# Patient Record
Sex: Male | Born: 1965 | Race: White | Hispanic: No | Marital: Married | Smoking: Never smoker
Health system: Southern US, Community
[De-identification: ages and names within clinical notes are randomized; demographics above are authoritative.]

## PROBLEM LIST (undated history)

## (undated) ENCOUNTER — Ambulatory Visit: Admission: EM | Payer: BC Managed Care – PPO

## (undated) DIAGNOSIS — R7303 Prediabetes: Secondary | ICD-10-CM

## (undated) DIAGNOSIS — Z8719 Personal history of other diseases of the digestive system: Secondary | ICD-10-CM

## (undated) DIAGNOSIS — K219 Gastro-esophageal reflux disease without esophagitis: Secondary | ICD-10-CM

## (undated) DIAGNOSIS — M109 Gout, unspecified: Secondary | ICD-10-CM

## (undated) DIAGNOSIS — M1990 Unspecified osteoarthritis, unspecified site: Secondary | ICD-10-CM

## (undated) DIAGNOSIS — K222 Esophageal obstruction: Secondary | ICD-10-CM

## (undated) DIAGNOSIS — J45909 Unspecified asthma, uncomplicated: Secondary | ICD-10-CM

## (undated) HISTORY — DX: Esophageal obstruction: K22.2

## (undated) HISTORY — PX: KNEE ARTHROSCOPY: SHX127

---

## 2007-07-16 ENCOUNTER — Ambulatory Visit: Payer: Self-pay | Admitting: Orthopaedic Surgery

## 2007-08-20 ENCOUNTER — Ambulatory Visit: Payer: Self-pay | Admitting: Orthopaedic Surgery

## 2008-11-16 ENCOUNTER — Emergency Department: Payer: Self-pay | Admitting: Emergency Medicine

## 2010-01-20 ENCOUNTER — Ambulatory Visit: Payer: Self-pay | Admitting: Otolaryngology

## 2010-05-05 ENCOUNTER — Ambulatory Visit: Payer: Self-pay | Admitting: Otolaryngology

## 2013-09-05 LAB — LIPID PANEL
CHOLESTEROL: 252 mg/dL — AB (ref 0–200)
HDL: 50 mg/dL (ref 35–70)
LDL CALC: 166 mg/dL
Triglycerides: 181 mg/dL — AB (ref 40–160)

## 2013-09-05 LAB — BASIC METABOLIC PANEL
BUN: 19 mg/dL (ref 4–21)
Creatinine: 1.1 mg/dL (ref 0.6–1.3)
GLUCOSE: 97 mg/dL
POTASSIUM: 4.5 mmol/L (ref 3.4–5.3)
Sodium: 143 mmol/L (ref 137–147)

## 2013-09-05 LAB — PSA: PSA: 0.9

## 2015-09-06 DIAGNOSIS — M109 Gout, unspecified: Secondary | ICD-10-CM | POA: Insufficient documentation

## 2015-09-06 DIAGNOSIS — Z8042 Family history of malignant neoplasm of prostate: Secondary | ICD-10-CM | POA: Insufficient documentation

## 2015-09-06 DIAGNOSIS — K648 Other hemorrhoids: Secondary | ICD-10-CM | POA: Insufficient documentation

## 2015-09-06 DIAGNOSIS — E669 Obesity, unspecified: Secondary | ICD-10-CM | POA: Insufficient documentation

## 2015-09-06 DIAGNOSIS — J309 Allergic rhinitis, unspecified: Secondary | ICD-10-CM | POA: Insufficient documentation

## 2015-09-06 DIAGNOSIS — J45909 Unspecified asthma, uncomplicated: Secondary | ICD-10-CM | POA: Insufficient documentation

## 2015-09-06 DIAGNOSIS — E785 Hyperlipidemia, unspecified: Secondary | ICD-10-CM | POA: Insufficient documentation

## 2015-10-08 ENCOUNTER — Encounter: Payer: Self-pay | Admitting: Family Medicine

## 2015-10-08 ENCOUNTER — Ambulatory Visit (INDEPENDENT_AMBULATORY_CARE_PROVIDER_SITE_OTHER): Payer: BLUE CROSS/BLUE SHIELD | Admitting: Family Medicine

## 2015-10-08 VITALS — BP 120/70 | HR 79 | Temp 98.3°F | Resp 16 | Ht 72.0 in | Wt 251.0 lb

## 2015-10-08 DIAGNOSIS — Z1159 Encounter for screening for other viral diseases: Secondary | ICD-10-CM

## 2015-10-08 DIAGNOSIS — Z8739 Personal history of other diseases of the musculoskeletal system and connective tissue: Secondary | ICD-10-CM

## 2015-10-08 DIAGNOSIS — Z1211 Encounter for screening for malignant neoplasm of colon: Secondary | ICD-10-CM

## 2015-10-08 DIAGNOSIS — Z125 Encounter for screening for malignant neoplasm of prostate: Secondary | ICD-10-CM

## 2015-10-08 DIAGNOSIS — Z0000 Encounter for general adult medical examination without abnormal findings: Secondary | ICD-10-CM

## 2015-10-08 DIAGNOSIS — E669 Obesity, unspecified: Secondary | ICD-10-CM

## 2015-10-08 DIAGNOSIS — Z8639 Personal history of other endocrine, nutritional and metabolic disease: Secondary | ICD-10-CM | POA: Diagnosis not present

## 2015-10-08 MED ORDER — HYDROCORTISONE ACETATE 25 MG RE SUPP: 25.0000 mg | Freq: Two times a day (BID) | RECTAL | Status: DC

## 2015-10-08 NOTE — Progress Notes (Signed)
Patient: Gilbert Lee, Male    DOB: 19-Feb-1966, 50 y.o.   MRN: [id number] Visit Date: 10/08/2015  Today's Provider: Lelon Huh, MD   Chief Complaint  Patient presents with  . Annual Exam  . Obesity   Subjective:    Annual physical exam Gilbert Lee is a 50 y.o. male who presents today for health maintenance and complete physical. He feels fairly well. He reports exercising yes. He reports he is sleeping well.  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 Improved with diet changes, exercise and weight loss. He has elevated uric acid around 9.6 several years ago and was prescribed allopurinol, but he decided he didn't want to take prescription. He did start taking Tumeric and tart juice about a month ago.   Obesity: From 09/04/2013-advised to continue diet and exercise.  Cholesterol Check: From 09/04/2013-checked labs, which showed cholesterol fairly high at 252. Advised to reduce saturated fats in diet and recheck labs yearly.      Review of Systems  Constitutional: Negative.   HENT: Negative.   Eyes: Negative.   Respiratory: Negative.   Cardiovascular: Negative.   Gastrointestinal: Negative.   Endocrine: Negative.   Genitourinary: Negative.   Musculoskeletal: Positive for back pain and arthralgias.  Skin: Negative.   Allergic/Immunologic: Negative.   Neurological: Negative.   Hematological: Negative.   Psychiatric/Behavioral: Negative.   All other systems reviewed and are negative.   Social History      He  reports that he has never smoked. He does not have any smokeless tobacco history on file. He reports that he drinks alcohol. He reports that he does not use illicit drugs.       Social History   Social History  . Marital Status: Married    Spouse Name: N/A  . Number of Children: N/A  . Years of Education: N/A   Social History Main Topics  . Smoking status: Never Smoker     . Smokeless tobacco: None  . Alcohol Use: 0.0 oz/week    0 Standard drinks or equivalent per week  . Drug Use: No  . Sexual Activity: Not Asked   Other Topics Concern  . None   Social History Narrative    No past medical history on file.   Patient Active Problem List   Diagnosis Date Noted  . Allergic rhinitis 09/06/2015  . 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 Family history of malignant neoplasm of prostate 09/06/2015  . 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 H/O hypercholesterolemia 09/06/2015  . Hemorrhoids, internal 09/06/2015  . Adiposity 09/06/2015    Past Surgical History  Procedure Laterality Date  . Knee arthroscopy Right     Family History        Family Status  Relation Status Death Age  . Father Deceased         His family history includes Other in his father; Parkinsonism in his mother; Prostate cancer in his father.    No Known Allergies  Current Meds  Medication Sig  . Cetirizine HCl (ZYRTEC ALLERGY) 10 MG CAPS Take by mouth.  . Misc Natural Products (TART CHERRY ADVANCED) CAPS Take by mouth.  . TURMERIC PO Take by mouth.    Patient Care Team: Birdie Sons, MD as PCP - General (Family Medicine)     Objective:   Vitals: BP 120/70 mmHg  Pulse 79  Temp(Src) 98.3 F (36.8 C) (Oral)  Resp 16  Ht 6' (1.829 m)  Wt 251 lb ([phone]  kg)  BMI 34.03 kg/m2  SpO2 98%   Physical Exam   General Appearance:    Alert, cooperative, no distress, appears stated age, obese  Head:    Normocephalic, without obvious abnormality, atraumatic  Eyes:    PERRL, conjunctiva/corneas clear, EOM's intact, fundi    benign, both eyes       Ears:    Normal TM's and external ear canals, both ears  Nose:   Nares normal, septum midline, mucosa normal, no drainage   or sinus tenderness  Throat:   Lips, mucosa, and tongue normal; teeth and gums normal  Neck:   Supple, symmetrical, trachea midline, no adenopathy;       thyroid:  No enlargement/tenderness/nodules; no  carotid   bruit or JVD  Back:     Symmetric, no curvature, ROM normal, no CVA tenderness  Lungs:     Clear to auscultation bilaterally, respirations unlabored  Chest wall:    No tenderness or deformity  Heart:    Regular rate and rhythm, S1 and S2 normal, no murmur, rub   or gallop  Abdomen:     Soft, non-tender, bowel sounds active all four quadrants,    no masses, no organomegaly  Genitalia:    deferred  Rectal:    deferred  Extremities:   Extremities normal, atraumatic, no cyanosis or edema  Pulses:   2+ and symmetric all extremities  Skin:   Skin color, texture, turgor normal, no rashes or lesions  Lymph nodes:   Cervical, supraclavicular, and axillary nodes normal  Neurologic:   CNII-XII intact. Normal strength, sensation and reflexes      throughout    Depression Screen PHQ 2/9 Scores 10/08/2015  PHQ - 2 Score 0  PHQ- 9 Score 0      Assessment & Plan:     Routine Health Maintenance and Physical Exam  Exercise Activities and Dietary recommendations Goals    None      Immunization History  Administered Date(s) Administered  . Tdap 09/04/2013    Health Maintenance  Topic Date Due  . Hepatitis C Screening  09[birth year]  . HIV Screening  11/04/1978  . COLONOSCOPY  11/03/2013  . INFLUENZA VACCINE  10/19/2015  . TETANUS/TDAP  09/05/2023      Discussed health benefits of physical activity, and encouraged him to engage in regular exercise appropriate for his age and condition.    --------------------------------------------------------------------  1. Annual physical exam Generally doing well. Recommend 81mg  ECASA daily - Comprehensive metabolic panel - Lipid panel - TSH - EKG 12-Lead  2. History of gout Now on Tart cherry juice and Tumeric. Consider allopurinol - Uric acid  3. Need for hepatitis C screening test  - Hepatitis C antibody  4. Colon cancer screening  - Ambulatory referral to Gastroenterology  5. Prostate cancer screening   6.  Obesity  - TSH   Lelon Huh, MD  [facility]

## 2015-10-12 ENCOUNTER — Telehealth: Payer: Self-pay | Admitting: Family Medicine

## 2015-10-12 LAB — COMPREHENSIVE METABOLIC PANEL
A/G RATIO: 1.8 (ref 1.2–2.2)
ALBUMIN: 4.4 g/dL (ref 3.5–5.5)
ALT: 22 IU/L (ref 0–44)
AST: 19 IU/L (ref 0–40)
Alkaline Phosphatase: 50 IU/L (ref 39–117)
BILIRUBIN TOTAL: 0.4 mg/dL (ref 0.0–1.2)
BUN / CREAT RATIO: 14 (ref 9–20)
BUN: 16 mg/dL (ref 6–24)
CO2: 22 mmol/L (ref 18–29)
Calcium: 9.4 mg/dL (ref 8.7–10.2)
Chloride: 102 mmol/L (ref 96–106)
Creatinine, Ser: 1.15 mg/dL (ref 0.76–1.27)
GFR, EST AFRICAN AMERICAN: 85 mL/min/{1.73_m2} (ref >59)
GFR, EST NON AFRICAN AMERICAN: 73 mL/min/{1.73_m2} (ref >59)
Globulin, Total: 2.5 g/dL (ref 1.5–4.5)
Glucose: 113 mg/dL — ABNORMAL HIGH (ref 65–99)
POTASSIUM: 4.4 mmol/L (ref 3.5–5.2)
Sodium: 142 mmol/L (ref 134–144)
TOTAL PROTEIN: 6.9 g/dL (ref 6.0–8.5)

## 2015-10-12 LAB — LIPID PANEL
CHOL/HDL RATIO: 7 ratio — AB (ref 0.0–5.0)
Cholesterol, Total: 259 mg/dL — ABNORMAL HIGH (ref 100–199)
HDL: 37 mg/dL — ABNORMAL LOW (ref >39)
LDL Calculated: 154 mg/dL — ABNORMAL HIGH (ref 0–99)
Triglycerides: 340 mg/dL — ABNORMAL HIGH (ref 0–149)
VLDL CHOLESTEROL CAL: 68 mg/dL — AB (ref 5–40)

## 2015-10-12 LAB — HEPATITIS C ANTIBODY

## 2015-10-12 LAB — TSH: TSH: 1.61 u[IU]/mL (ref 0.450–4.500)

## 2015-10-12 LAB — URIC ACID: URIC ACID: 9.9 mg/dL — AB (ref 3.7–8.6)

## 2015-10-12 MED ORDER — ALLOPURINOL 100 MG PO TABS: 100.0000 mg | ORAL_TABLET | Freq: Every day | ORAL | 5 refills | Status: DC

## 2015-10-12 NOTE — Telephone Encounter (Signed)
Pt is requesting results from lab work

## 2015-10-12 NOTE — Telephone Encounter (Signed)
Please review. Thanks!  

## 2015-10-12 NOTE — Telephone Encounter (Signed)
Advised patient as below. Patient reports that he continues to have gout flares and would like to start allopurinol 100mg  daily to help with the symptoms. Patient requested educational info about low cholesterol diet. Left copies of low cholesterol diet up front to pick up. Medication was sent into patient's pharmacy.     Notes Recorded by Birdie Sons, MD on 10/12/2015 at 1:35 PM EDT Blood sugar is mildly elevated at 113... This is in pre-diabetic range and he needs to work on losing weight by eliminating sweets and white starchy foods from diet, and exercising 150 minutes per week.  Cholesterol is high at 259, should be under 230. This should come down with same dietary changes.  Uric acid levels are moderately elevated at 9.9, normal is less than 8. If he is having more than one gout flare a month he should start allopurinol 100mg  daily, #30 rf x 5, and take colchicine 0.6mg  daily for 30 days.  Need to schedule follow up 6 months to check lipids, sugar, and uric acid

## 2015-10-15 ENCOUNTER — Other Ambulatory Visit: Payer: Self-pay

## 2015-10-15 ENCOUNTER — Telehealth: Payer: Self-pay

## 2015-10-15 NOTE — Telephone Encounter (Signed)
Screening colonoscopy Z12.11 [facility] [id number] Please pre cert

## 2015-10-15 NOTE — Telephone Encounter (Signed)
Gastroenterology Pre-Procedure Review  Request Date: 11/11/2015 Requesting Physician: Dr. Caryn Section  PATIENT REVIEW QUESTIONS: The patient responded to the following health history questions as indicated:    1. Are you having any GI issues? no 2. Do you have a personal history of Polyps? no 3. Do you have a family history of Colon Cancer or Polyps? no 4. Diabetes Mellitus? no 5. Joint replacements in the past 12 months?no 6. Major health problems in the past 3 months?no 7. Any artificial heart valves, MVP, or defibrillator?no    MEDICATIONS & ALLERGIES:    Patient reports the following regarding taking any anticoagulation/antiplatelet therapy:   Plavix, Coumadin, Eliquis, Xarelto, Lovenox, Pradaxa, Brilinta, or Effient? no Aspirin? yes (heart health)  Patient confirms/reports the following medications:  Current Outpatient Prescriptions  Medication Sig Dispense Refill  . allopurinol (ZYLOPRIM) 100 MG tablet Take 1 tablet (100 mg total) by mouth daily. 30 tablet 5  . aspirin 81 MG tablet Take 81 mg by mouth daily.    . magnesium 30 MG tablet Take 30 mg by mouth 2 (two) times daily.    . Misc Natural Products (TART CHERRY ADVANCED) CAPS Take by mouth.    . TURMERIC PO Take by mouth.     No current facility-administered medications for this visit.     Patient confirms/reports the following allergies:  No Known Allergies  No orders of the defined types were placed in this encounter.   AUTHORIZATION INFORMATION Primary Insurance: 1D#: Group #:  Secondary Insurance: 1D#: Group #:  SCHEDULE INFORMATION: Date: 11/11/2015 Time: Location: [facility]

## 2015-10-19 DIAGNOSIS — M9903 Segmental and somatic dysfunction of lumbar region: Secondary | ICD-10-CM | POA: Diagnosis not present

## 2015-10-19 DIAGNOSIS — M9902 Segmental and somatic dysfunction of thoracic region: Secondary | ICD-10-CM | POA: Diagnosis not present

## 2015-10-19 DIAGNOSIS — M9901 Segmental and somatic dysfunction of cervical region: Secondary | ICD-10-CM | POA: Diagnosis not present

## 2015-10-22 DIAGNOSIS — M9901 Segmental and somatic dysfunction of cervical region: Secondary | ICD-10-CM | POA: Diagnosis not present

## 2015-10-22 DIAGNOSIS — M9902 Segmental and somatic dysfunction of thoracic region: Secondary | ICD-10-CM | POA: Diagnosis not present

## 2015-10-22 DIAGNOSIS — M9903 Segmental and somatic dysfunction of lumbar region: Secondary | ICD-10-CM | POA: Diagnosis not present

## 2015-10-26 DIAGNOSIS — M62830 Muscle spasm of back: Secondary | ICD-10-CM | POA: Diagnosis not present

## 2015-10-26 DIAGNOSIS — M9901 Segmental and somatic dysfunction of cervical region: Secondary | ICD-10-CM | POA: Diagnosis not present

## 2015-10-26 DIAGNOSIS — M9903 Segmental and somatic dysfunction of lumbar region: Secondary | ICD-10-CM | POA: Diagnosis not present

## 2015-10-26 DIAGNOSIS — M5416 Radiculopathy, lumbar region: Secondary | ICD-10-CM | POA: Diagnosis not present

## 2015-10-28 DIAGNOSIS — M9901 Segmental and somatic dysfunction of cervical region: Secondary | ICD-10-CM | POA: Diagnosis not present

## 2015-10-28 DIAGNOSIS — M62830 Muscle spasm of back: Secondary | ICD-10-CM | POA: Diagnosis not present

## 2015-10-28 DIAGNOSIS — M9903 Segmental and somatic dysfunction of lumbar region: Secondary | ICD-10-CM | POA: Diagnosis not present

## 2015-10-28 DIAGNOSIS — M5416 Radiculopathy, lumbar region: Secondary | ICD-10-CM | POA: Diagnosis not present

## 2015-10-29 DIAGNOSIS — M62830 Muscle spasm of back: Secondary | ICD-10-CM | POA: Diagnosis not present

## 2015-10-29 DIAGNOSIS — M5416 Radiculopathy, lumbar region: Secondary | ICD-10-CM | POA: Diagnosis not present

## 2015-10-29 DIAGNOSIS — M9901 Segmental and somatic dysfunction of cervical region: Secondary | ICD-10-CM | POA: Diagnosis not present

## 2015-10-29 DIAGNOSIS — M9903 Segmental and somatic dysfunction of lumbar region: Secondary | ICD-10-CM | POA: Diagnosis not present

## 2015-11-02 DIAGNOSIS — M62830 Muscle spasm of back: Secondary | ICD-10-CM | POA: Diagnosis not present

## 2015-11-02 DIAGNOSIS — M5416 Radiculopathy, lumbar region: Secondary | ICD-10-CM | POA: Diagnosis not present

## 2015-11-02 DIAGNOSIS — M9903 Segmental and somatic dysfunction of lumbar region: Secondary | ICD-10-CM | POA: Diagnosis not present

## 2015-11-02 DIAGNOSIS — M9901 Segmental and somatic dysfunction of cervical region: Secondary | ICD-10-CM | POA: Diagnosis not present

## 2015-11-05 ENCOUNTER — Encounter: Payer: Self-pay | Admitting: *Deleted

## 2015-11-05 DIAGNOSIS — M62830 Muscle spasm of back: Secondary | ICD-10-CM | POA: Diagnosis not present

## 2015-11-05 DIAGNOSIS — M5416 Radiculopathy, lumbar region: Secondary | ICD-10-CM | POA: Diagnosis not present

## 2015-11-05 DIAGNOSIS — M9901 Segmental and somatic dysfunction of cervical region: Secondary | ICD-10-CM | POA: Diagnosis not present

## 2015-11-05 DIAGNOSIS — M9903 Segmental and somatic dysfunction of lumbar region: Secondary | ICD-10-CM | POA: Diagnosis not present

## 2015-11-08 DIAGNOSIS — M9903 Segmental and somatic dysfunction of lumbar region: Secondary | ICD-10-CM | POA: Diagnosis not present

## 2015-11-08 DIAGNOSIS — M62830 Muscle spasm of back: Secondary | ICD-10-CM | POA: Diagnosis not present

## 2015-11-08 DIAGNOSIS — M5416 Radiculopathy, lumbar region: Secondary | ICD-10-CM | POA: Diagnosis not present

## 2015-11-08 DIAGNOSIS — M9901 Segmental and somatic dysfunction of cervical region: Secondary | ICD-10-CM | POA: Diagnosis not present

## 2015-11-10 DIAGNOSIS — M5416 Radiculopathy, lumbar region: Secondary | ICD-10-CM | POA: Diagnosis not present

## 2015-11-10 DIAGNOSIS — M62830 Muscle spasm of back: Secondary | ICD-10-CM | POA: Diagnosis not present

## 2015-11-10 DIAGNOSIS — M9901 Segmental and somatic dysfunction of cervical region: Secondary | ICD-10-CM | POA: Diagnosis not present

## 2015-11-10 DIAGNOSIS — M9903 Segmental and somatic dysfunction of lumbar region: Secondary | ICD-10-CM | POA: Diagnosis not present

## 2015-11-11 ENCOUNTER — Ambulatory Visit: Payer: BLUE CROSS/BLUE SHIELD | Admitting: Anesthesiology

## 2015-11-11 ENCOUNTER — Ambulatory Visit
Admission: RE | Admit: 2015-11-11 | Discharge: 2015-11-11 | Disposition: A | Discharge status: Home / Self Care | Payer: BLUE CROSS/BLUE SHIELD | Source: Ambulatory Visit | Attending: Gastroenterology | Admitting: Gastroenterology

## 2015-11-11 ENCOUNTER — Encounter
Admission: RE | Disposition: A | Discharge status: Home / Self Care | Payer: Self-pay | Source: Ambulatory Visit | Attending: Gastroenterology

## 2015-11-11 DIAGNOSIS — Z7982 Long term (current) use of aspirin: Secondary | ICD-10-CM | POA: Diagnosis not present

## 2015-11-11 DIAGNOSIS — Z8042 Family history of malignant neoplasm of prostate: Secondary | ICD-10-CM | POA: Diagnosis not present

## 2015-11-11 DIAGNOSIS — K635 Polyp of colon: Secondary | ICD-10-CM | POA: Insufficient documentation

## 2015-11-11 DIAGNOSIS — Z820 Family history of epilepsy and other diseases of the nervous system: Secondary | ICD-10-CM | POA: Insufficient documentation

## 2015-11-11 DIAGNOSIS — K641 Second degree hemorrhoids: Secondary | ICD-10-CM | POA: Insufficient documentation

## 2015-11-11 DIAGNOSIS — D123 Benign neoplasm of transverse colon: Secondary | ICD-10-CM | POA: Diagnosis not present

## 2015-11-11 DIAGNOSIS — Z79899 Other long term (current) drug therapy: Secondary | ICD-10-CM | POA: Insufficient documentation

## 2015-11-11 DIAGNOSIS — D125 Benign neoplasm of sigmoid colon: Secondary | ICD-10-CM

## 2015-11-11 DIAGNOSIS — M109 Gout, unspecified: Secondary | ICD-10-CM | POA: Insufficient documentation

## 2015-11-11 DIAGNOSIS — Z9889 Other specified postprocedural states: Secondary | ICD-10-CM | POA: Insufficient documentation

## 2015-11-11 DIAGNOSIS — J45909 Unspecified asthma, uncomplicated: Secondary | ICD-10-CM | POA: Diagnosis not present

## 2015-11-11 DIAGNOSIS — Z8249 Family history of ischemic heart disease and other diseases of the circulatory system: Secondary | ICD-10-CM | POA: Diagnosis not present

## 2015-11-11 DIAGNOSIS — Z1211 Encounter for screening for malignant neoplasm of colon: Secondary | ICD-10-CM

## 2015-11-11 HISTORY — DX: Gout, unspecified: M10.9

## 2015-11-11 HISTORY — PX: POLYPECTOMY: SHX5525

## 2015-11-11 HISTORY — PX: COLONOSCOPY WITH PROPOFOL: SHX5780

## 2015-11-11 HISTORY — DX: Unspecified asthma, uncomplicated: J45.909

## 2015-11-11 SURGERY — COLONOSCOPY WITH PROPOFOL
Anesthesia: Monitor Anesthesia Care | Site: Rectum | Wound class: Contaminated

## 2015-11-11 MED ORDER — PROPOFOL 10 MG/ML IV BOLUS
INTRAVENOUS | Status: DC | PRN
  Administered 2015-11-11: 70 mg via INTRAVENOUS
  Administered 2015-11-11 (×2): 20 mg via INTRAVENOUS
  Administered 2015-11-11: 10 mg via INTRAVENOUS
  Administered 2015-11-11: 30 mg via INTRAVENOUS

## 2015-11-11 MED ORDER — STERILE WATER FOR IRRIGATION IR SOLN
Status: DC | PRN
  Administered 2015-11-11: 11:00:00

## 2015-11-11 MED ORDER — LIDOCAINE HCL (CARDIAC) 20 MG/ML IV SOLN
INTRAVENOUS | Status: DC | PRN
  Administered 2015-11-11: 50 mg via INTRAVENOUS

## 2015-11-11 MED ORDER — LACTATED RINGERS IV SOLN
INTRAVENOUS | Status: DC | PRN
  Administered 2015-11-11: 11:00:00 via INTRAVENOUS

## 2015-11-11 SURGICAL SUPPLY — 23 items
CANISTER SUCT 1200ML W/VALVE (MISCELLANEOUS) ×2 IMPLANT
CLIP HMST 235XBRD CATH ROT (MISCELLANEOUS) IMPLANT
CLIP RESOLUTION 360 11X235 (MISCELLANEOUS)
FCP ESCP3.2XJMB 240X2.8X (MISCELLANEOUS)
FORCEPS BIOP RAD 4 LRG CAP 4 (CUTTING FORCEPS) ×2 IMPLANT
FORCEPS BIOP RJ4 240 W/NDL (MISCELLANEOUS)
FORCEPS ESCP3.2XJMB 240X2.8X (MISCELLANEOUS) IMPLANT
GOWN CVR UNV OPN BCK APRN NK (MISCELLANEOUS) ×2 IMPLANT
GOWN ISOL THUMB LOOP REG UNIV (MISCELLANEOUS) ×2
INJECTOR VARIJECT VIN23 (MISCELLANEOUS) IMPLANT
KIT DEFENDO VALVE AND CONN (KITS) IMPLANT
KIT ENDO PROCEDURE OLY (KITS) ×2 IMPLANT
MARKER SPOT ENDO TATTOO 5ML (MISCELLANEOUS) IMPLANT
PAD GROUND ADULT SPLIT (MISCELLANEOUS) IMPLANT
PROBE APC STR FIRE (PROBE) IMPLANT
RETRIEVER NET ROTH 2.5X230 LF (MISCELLANEOUS) ×2 IMPLANT
SNARE SHORT THROW 13M SML OVAL (MISCELLANEOUS) IMPLANT
SNARE SHORT THROW 30M LRG OVAL (MISCELLANEOUS) IMPLANT
SNARE SNG USE RND 15MM (INSTRUMENTS) IMPLANT
SPOT EX ENDOSCOPIC TATTOO (MISCELLANEOUS)
TRAP ETRAP POLY (MISCELLANEOUS) IMPLANT
VARIJECT INJECTOR VIN23 (MISCELLANEOUS)
WATER STERILE IRR 250ML POUR (IV SOLUTION) ×2 IMPLANT

## 2015-11-11 NOTE — Anesthesia Procedure Notes (Signed)
Procedure Name: MAC Performed by: Lanette Ell Pre-anesthesia Checklist: Patient identified, Emergency Drugs available, Suction available, Timeout performed and Patient being monitored Patient Re-evaluated:Patient Re-evaluated prior to inductionOxygen Delivery Method: Nasal cannula Placement Confirmation: positive ETCO2       

## 2015-11-11 NOTE — Op Note (Signed)
[facility] Patient Name: Gilbert Lee Procedure Date: 11/11/2015 11:11 AM MRN: [id number] Account #: [id number] Date of Birth: 1965-06-05 Admit Type: Outpatient Age: 50 Room: [facility] OR ROOM 01 Gender: Male Note Status: Finalized Procedure:            Colonoscopy Indications:          Screening for colorectal malignant neoplasm Providers:            Lucilla Lame MD, MD Referring MD:         Kirstie Peri. Caryn Section, MD (Referring MD) Medicines:            Propofol per Anesthesia Complications:        No immediate complications. Procedure:            Pre-Anesthesia Assessment:                       - Prior to the procedure, a History and Physical was                        performed, and patient medications and allergies were                        reviewed. The patient's tolerance of previous                        anesthesia was also reviewed. The risks and benefits of                        the procedure and the sedation options and risks were                        discussed with the patient. All questions were                        answered, and informed consent was obtained. Prior                        Anticoagulants: The patient has taken no previous                        anticoagulant or antiplatelet agents. ASA Grade                        Assessment: II - A patient with mild systemic disease.                        After reviewing the risks and benefits, the patient was                        deemed in satisfactory condition to undergo the                        procedure.                       After obtaining informed consent, the colonoscope was                        passed under direct vision. Throughout the procedure,  the patient's blood pressure, pulse, and oxygen                        saturations were monitored continuously. The Olympus                        CF-HQ190L Colonoscope (S#. [id number]) was introduced                     through the anus and advanced to the the cecum,                        identified by appendiceal orifice and ileocecal valve.                        The colonoscopy was performed without difficulty. The                        patient tolerated the procedure well. The quality of                        the bowel preparation was excellent. Findings:      Two sessile polyps were found in the transverse colon. The polyps were 2       to 3 mm in size. These polyps were removed with a cold biopsy forceps.       Resection and retrieval were complete.      Two sessile polyps were found in the sigmoid colon. The polyps were 2 to       3 mm in size. These polyps were removed with a cold biopsy forceps.       Resection and retrieval were complete.      Non-bleeding internal hemorrhoids were found during retroflexion. The       hemorrhoids were Grade II (internal hemorrhoids that prolapse but reduce       spontaneously). Impression:           - Two 2 to 3 mm polyps in the transverse colon, removed                        with a cold biopsy forceps. Resected and retrieved.                       - Two 2 to 3 mm polyps in the sigmoid colon, removed                        with a cold biopsy forceps. Resected and retrieved.                       - Non-bleeding internal hemorrhoids. Recommendation:       - Await pathology results.                       - Repeat colonoscopy in 5 years if polyp adenoma and 10                        years if hyperplastic Procedure Code(s):    --- Professional ---                       [phone], Colonoscopy, flexible; with biopsy, single or  multiple Diagnosis Code(s):    --- Professional ---                       Z12.11, Encounter for screening for malignant neoplasm                        of colon                       D12.3, Benign neoplasm of transverse colon (hepatic                        flexure or splenic flexure)                        D12.5, Benign neoplasm of sigmoid colon CPT copyright 2016 American Medical Association. All rights reserved. The codes documented in this report are preliminary and upon coder review may  be revised to meet current compliance requirements. Lucilla Lame MD, MD 11/11/2015 11:27:05 AM This report has been signed electronically. Number of Addenda: 0 Note Initiated On: 11/11/2015 11:11 AM Scope Withdrawal Time: 0 hours 6 minutes 50 seconds  Total Procedure Duration: 0 hours 8 minutes 13 seconds       [facility]

## 2015-11-11 NOTE — Anesthesia Preprocedure Evaluation (Signed)
Anesthesia Evaluation  Patient identified by MRN, date of birth, ID band Patient awake    Reviewed: Allergy & Precautions, NPO status , Patient's Chart, lab work & pertinent test results  Airway Mallampati: II  TM Distance: >3 FB Neck ROM: Full    Dental no notable dental hx.    Pulmonary neg pulmonary ROS, asthma ,  Mild asthma with exercise. No inhaler use for 1.5 months   Pulmonary exam normal breath sounds clear to auscultation       Cardiovascular negative cardio ROS Normal cardiovascular exam Rhythm:Regular Rate:Normal     Neuro/Psych negative neurological ROS  negative psych ROS   GI/Hepatic negative GI ROS, Neg liver ROS,   Endo/Other  negative endocrine ROS  Renal/GU negative Renal ROS  negative genitourinary   Musculoskeletal negative musculoskeletal ROS (+) gout   Abdominal   Peds negative pediatric ROS (+)  Hematology negative hematology ROS (+)   Anesthesia Other Findings   Reproductive/Obstetrics negative OB ROS                             Anesthesia Physical Anesthesia Plan  ASA: II  Anesthesia Plan: MAC   Post-op Pain Management:    Induction: Intravenous  Airway Management Planned:   Additional Equipment:   Intra-op Plan:   Post-operative Plan: Extubation in OR  Informed Consent: I have reviewed the patients History and Physical, chart, labs and discussed the procedure including the risks, benefits and alternatives for the proposed anesthesia with the patient or authorized representative who has indicated his/her understanding and acceptance.   Dental advisory given  Plan Discussed with: CRNA  Anesthesia Plan Comments:         Anesthesia Quick Evaluation

## 2015-11-11 NOTE — Transfer of Care (Signed)
Immediate Anesthesia Transfer of Care Note  Patient: Gilbert Lee  Procedure(s) Performed: Procedure(s): COLONOSCOPY WITH PROPOFOL (N/A) POLYPECTOMY (N/A)  Patient Location: PACU  Anesthesia Type: MAC  Level of Consciousness: awake, alert  and patient cooperative  Airway and Oxygen Therapy: Patient Spontanous Breathing and Patient connected to supplemental oxygen  Post-op Assessment: Post-op Vital signs reviewed, Patient's Cardiovascular Status Stable, Respiratory Function Stable, Patent Airway and No signs of Nausea or vomiting  Post-op Vital Signs: Reviewed and stable  Complications: No apparent anesthesia complications

## 2015-11-11 NOTE — H&P (Signed)
  Lucilla Lame, MD [facility] [address] [place], [place] [address] Phone: [phone] Fax : [phone]  Primary Care Physician:  Lelon Huh, MD Primary Gastroenterologist:  Dr. Allen Norris  Pre-Procedure History & Physical: HPI:  Gilbert Lee is a 50 y.o. male is here for a screening colonoscopy.   Past Medical History:  Diagnosis Date  . Asthma   . Gout     Past Surgical History:  Procedure Laterality Date  . KNEE ARTHROSCOPY Right     Prior to Admission medications   Medication Sig Start Date End Date Taking? Authorizing Provider  aspirin 81 MG tablet Take 81 mg by mouth daily.   Yes Historical Provider, MD  magnesium 30 MG tablet Take 30 mg by mouth 2 (two) times daily.   Yes Historical Provider, MD  Misc Natural Products (TART CHERRY ADVANCED) CAPS Take by mouth.   Yes Historical Provider, MD  TURMERIC PO Take by mouth.   Yes Historical Provider, MD  allopurinol (ZYLOPRIM) 100 MG tablet Take 1 tablet (100 mg total) by mouth daily. Patient not taking: Reported on 11/05/2015 10/12/15   Birdie Sons, MD    Allergies as of 10/15/2015  . (No Known Allergies)    Family History  Problem Relation Age of Onset  . Other Father     intestinal obstruction  . Prostate cancer Father   . Parkinsonism Mother   . Deep vein thrombosis Brother   . Deep vein thrombosis Brother     Social History   Social History  . Marital status: Married    Spouse name: N/A  . Number of children: N/A  . Years of education: N/A   Occupational History  . Not on file.   Social History Main Topics  . Smoking status: Never Smoker  . Smokeless tobacco: Never Used  . Alcohol use 0.6 oz/week    1 Glasses of wine per week  . Drug use: No  . Sexual activity: Not on file   Other Topics Concern  . Not on file   Social History Narrative  . No narrative on file    Review of Systems: See HPI, otherwise negative ROS  Physical Exam: BP (!) 142/75   Pulse 78   Temp 97.8 F (36.6  C) (Temporal)   Ht 6' (1.829 m)   Wt 236 lb (107 kg)   SpO2 100%   BMI 32.01 kg/m  General:   Alert,  pleasant and cooperative in NAD Head:  Normocephalic and atraumatic. Neck:  Supple; no masses or thyromegaly. Lungs:  Clear throughout to auscultation.    Heart:  Regular rate and rhythm. Abdomen:  Soft, nontender and nondistended. Normal bowel sounds, without guarding, and without rebound.   Neurologic:  Alert and  oriented x4;  grossly normal neurologically.  Impression/Plan: Gilbert Lee is now here to undergo a screening colonoscopy.  Risks, benefits, and alternatives regarding colonoscopy have been reviewed with the patient.  Questions have been answered.  All parties agreeable.

## 2015-11-11 NOTE — Discharge Instructions (Signed)

## 2015-11-11 NOTE — Anesthesia Postprocedure Evaluation (Signed)
Anesthesia Post Note  Patient: Gilbert Lee  Procedure(s) Performed: Procedure(s) (LRB): COLONOSCOPY WITH PROPOFOL (N/A) POLYPECTOMY (N/A)  Patient location during evaluation: PACU Anesthesia Type: MAC Level of consciousness: awake and alert Pain management: pain level controlled Vital Signs Assessment: post-procedure vital signs reviewed and stable Respiratory status: spontaneous breathing, nonlabored ventilation, respiratory function stable and patient connected to nasal cannula oxygen Cardiovascular status: stable and blood pressure returned to baseline Anesthetic complications: no    Norvell Ureste C

## 2015-11-12 ENCOUNTER — Encounter: Payer: Self-pay | Admitting: Gastroenterology

## 2015-11-15 DIAGNOSIS — M62830 Muscle spasm of back: Secondary | ICD-10-CM | POA: Diagnosis not present

## 2015-11-15 DIAGNOSIS — M5416 Radiculopathy, lumbar region: Secondary | ICD-10-CM | POA: Diagnosis not present

## 2015-11-15 DIAGNOSIS — M9903 Segmental and somatic dysfunction of lumbar region: Secondary | ICD-10-CM | POA: Diagnosis not present

## 2015-11-15 DIAGNOSIS — M9901 Segmental and somatic dysfunction of cervical region: Secondary | ICD-10-CM | POA: Diagnosis not present

## 2015-11-16 ENCOUNTER — Encounter: Payer: Self-pay | Admitting: Gastroenterology

## 2015-11-17 DIAGNOSIS — M9903 Segmental and somatic dysfunction of lumbar region: Secondary | ICD-10-CM | POA: Diagnosis not present

## 2015-11-17 DIAGNOSIS — M62830 Muscle spasm of back: Secondary | ICD-10-CM | POA: Diagnosis not present

## 2015-11-17 DIAGNOSIS — M5416 Radiculopathy, lumbar region: Secondary | ICD-10-CM | POA: Diagnosis not present

## 2015-11-17 DIAGNOSIS — M9901 Segmental and somatic dysfunction of cervical region: Secondary | ICD-10-CM | POA: Diagnosis not present

## 2015-11-18 ENCOUNTER — Encounter: Payer: Self-pay | Admitting: Gastroenterology

## 2015-11-24 DIAGNOSIS — M9901 Segmental and somatic dysfunction of cervical region: Secondary | ICD-10-CM | POA: Diagnosis not present

## 2015-11-24 DIAGNOSIS — M9903 Segmental and somatic dysfunction of lumbar region: Secondary | ICD-10-CM | POA: Diagnosis not present

## 2015-11-24 DIAGNOSIS — M62830 Muscle spasm of back: Secondary | ICD-10-CM | POA: Diagnosis not present

## 2015-11-24 DIAGNOSIS — M5416 Radiculopathy, lumbar region: Secondary | ICD-10-CM | POA: Diagnosis not present

## 2015-11-25 ENCOUNTER — Encounter: Payer: Self-pay | Admitting: Gastroenterology

## 2015-11-25 ENCOUNTER — Telehealth: Payer: Self-pay | Admitting: Gastroenterology

## 2015-11-25 NOTE — Telephone Encounter (Signed)
Please review pathology in labs

## 2015-11-25 NOTE — Telephone Encounter (Signed)
results

## 2015-12-03 DIAGNOSIS — M62830 Muscle spasm of back: Secondary | ICD-10-CM | POA: Diagnosis not present

## 2015-12-03 DIAGNOSIS — M9903 Segmental and somatic dysfunction of lumbar region: Secondary | ICD-10-CM | POA: Diagnosis not present

## 2015-12-03 DIAGNOSIS — M9901 Segmental and somatic dysfunction of cervical region: Secondary | ICD-10-CM | POA: Diagnosis not present

## 2015-12-03 DIAGNOSIS — M5416 Radiculopathy, lumbar region: Secondary | ICD-10-CM | POA: Diagnosis not present

## 2015-12-08 DIAGNOSIS — M5416 Radiculopathy, lumbar region: Secondary | ICD-10-CM | POA: Diagnosis not present

## 2015-12-08 DIAGNOSIS — M9901 Segmental and somatic dysfunction of cervical region: Secondary | ICD-10-CM | POA: Diagnosis not present

## 2015-12-08 DIAGNOSIS — M62830 Muscle spasm of back: Secondary | ICD-10-CM | POA: Diagnosis not present

## 2015-12-08 DIAGNOSIS — M9903 Segmental and somatic dysfunction of lumbar region: Secondary | ICD-10-CM | POA: Diagnosis not present

## 2015-12-10 ENCOUNTER — Ambulatory Visit
Admission: RE | Admit: 2015-12-10 | Discharge: 2015-12-10 | Disposition: A | Discharge status: Home / Self Care | Payer: BLUE CROSS/BLUE SHIELD | Source: Ambulatory Visit | Attending: Unknown Physician Specialty | Admitting: Unknown Physician Specialty

## 2015-12-10 ENCOUNTER — Other Ambulatory Visit: Payer: Self-pay | Admitting: Unknown Physician Specialty

## 2015-12-10 DIAGNOSIS — M25561 Pain in right knee: Secondary | ICD-10-CM

## 2015-12-10 DIAGNOSIS — M79604 Pain in right leg: Secondary | ICD-10-CM | POA: Diagnosis not present

## 2015-12-10 DIAGNOSIS — M179 Osteoarthritis of knee, unspecified: Secondary | ICD-10-CM | POA: Diagnosis not present

## 2015-12-11 ENCOUNTER — Encounter: Payer: Self-pay | Admitting: Urgent Care

## 2015-12-11 ENCOUNTER — Emergency Department
Admission: EM | Admit: 2015-12-11 | Discharge: 2015-12-11 | Disposition: A | Discharge status: Home / Self Care | Payer: BLUE CROSS/BLUE SHIELD | Attending: Emergency Medicine | Admitting: Emergency Medicine

## 2015-12-11 DIAGNOSIS — J45909 Unspecified asthma, uncomplicated: Secondary | ICD-10-CM | POA: Diagnosis not present

## 2015-12-11 DIAGNOSIS — M109 Gout, unspecified: Secondary | ICD-10-CM | POA: Diagnosis not present

## 2015-12-11 DIAGNOSIS — Z7982 Long term (current) use of aspirin: Secondary | ICD-10-CM | POA: Diagnosis not present

## 2015-12-11 DIAGNOSIS — Z79899 Other long term (current) drug therapy: Secondary | ICD-10-CM | POA: Diagnosis not present

## 2015-12-11 DIAGNOSIS — M25561 Pain in right knee: Secondary | ICD-10-CM | POA: Diagnosis not present

## 2015-12-11 DIAGNOSIS — M10061 Idiopathic gout, right knee: Secondary | ICD-10-CM | POA: Insufficient documentation

## 2015-12-11 LAB — SYNOVIAL CELL COUNT + DIFF, W/ CRYSTALS
EOSINOPHILS-SYNOVIAL: 0 %
LYMPHOCYTES-SYNOVIAL FLD: 8 %
Monocyte-Macrophage-Synovial Fluid: 0 %
NEUTROPHIL, SYNOVIAL: 92 %
OTHER CELLS-SYN: 0
WBC, SYNOVIAL: 26905 /mm3 — AB (ref 0–200)

## 2015-12-11 MED ORDER — HYDROCODONE-ACETAMINOPHEN 5-325 MG PO TABS: 1.0000 | ORAL_TABLET | Freq: Four times a day (QID) | ORAL | 0 refills | Status: DC | PRN

## 2015-12-11 MED ORDER — LIDOCAINE-EPINEPHRINE (PF) 1 %-1:200000 IJ SOLN
INTRAMUSCULAR | Status: AC
  Administered 2015-12-11: 10:00:00
  Filled 2015-12-11: qty 30

## 2015-12-11 MED ORDER — HYDROCODONE-ACETAMINOPHEN 5-325 MG PO TABS
1.0000 | ORAL_TABLET | Freq: Once | ORAL | Status: AC
  Administered 2015-12-11: 1 via ORAL
  Filled 2015-12-11: qty 1

## 2015-12-11 NOTE — ED Provider Notes (Signed)
[facility] Emergency Department Provider Note ____________________________________________   I have reviewed the triage vital signs and the triage nursing note.  HISTORY  Chief Complaint Gout and Knee Pain   Historian Patient  HPI Gilbert Lee is a 50 y.o. male with a history of gout, prior in the foot and the right knee, symptoms started a few days ago. He saw urgent care yesterday and because there is no knee swelling was treated under presumptive diagnosis of potential ligamentous strain. Overnight he developed significantly worse knee pain with obvious knee swelling. He states it feels justlike when he had gout before. He had previously in the past received cortisone injection and knee joint aspiration. Pain is moderate to severe. He is taking NSAIDs at home. No fever. No skin rash.    Past Medical History:  Diagnosis Date  . Asthma   . Gout     Patient Active Problem List   Diagnosis Date Noted  . Benign neoplasm of transverse colon   . Benign neoplasm of sigmoid colon   . Allergic rhinitis 09/06/2015  . 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 Family history of malignant neoplasm of prostate 09/06/2015  . 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 H/O hypercholesterolemia 09/06/2015  . Hemorrhoids, internal 09/06/2015  . Adiposity 09/06/2015    Past Surgical History:  Procedure Laterality Date  . COLONOSCOPY WITH PROPOFOL N/A 11/11/2015   Procedure: COLONOSCOPY WITH PROPOFOL;  Surgeon: Lucilla Lame, MD;  Location: [place];  Service: Endoscopy;  Laterality: N/A;  . KNEE ARTHROSCOPY Right   . POLYPECTOMY N/A 11/11/2015   Procedure: POLYPECTOMY;  Surgeon: Lucilla Lame, MD;  Location: [place];  Service: Endoscopy;  Laterality: N/A;    Prior to Admission medications   Medication Sig Start Date End Date Taking? Authorizing Provider  aspirin 81 MG tablet Take 81 mg by mouth daily.   Yes Historical Provider, MD  b complex vitamins tablet  Take 1 tablet by mouth daily.   Yes Historical Provider, MD  etodolac (LODINE) 500 MG tablet Take 500 mg by mouth 2 (two) times daily.   Yes Historical Provider, MD  magnesium 30 MG tablet Take 30 mg by mouth 2 (two) times daily.   Yes Historical Provider, MD  Misc Natural Products (TART CHERRY ADVANCED) CAPS Take 1 capsule by mouth daily.    Yes Historical Provider, MD  TURMERIC PO Take 1 capsule by mouth daily.    Yes Historical Provider, MD  HYDROcodone-acetaminophen (NORCO/VICODIN) 5-325 MG tablet Take 1 tablet by mouth every 6 (six) hours as needed for moderate pain. 12/11/15   Lisa Roca, MD    No Known Allergies  Family History  Problem Relation Age of Onset  . Other Father     intestinal obstruction  . Prostate cancer Father   . Parkinsonism Mother   . Deep vein thrombosis Brother   . Deep vein thrombosis Brother     Social History Social History  Substance Use Topics  . Smoking status: Never Smoker  . Smokeless tobacco: Never Used  . Alcohol use 0.6 oz/week    1 Glasses of wine per week    Review of Systems  Constitutional: Negative for fever. Eyes: Negative for visual changes. ENT: Negative for sore throat. Cardiovascular: Negative for chest pain. Respiratory: Negative for shortness of breath. Gastrointestinal: Negative for abdominal pain, vomiting and diarrhea. Genitourinary: Negative for dysuria. Musculoskeletal: Negative for back pain. Skin: Negative for rash. Neurological: Negative for headache. 10 point Review of Systems otherwise negative ____________________________________________  PHYSICAL EXAM:  VITAL SIGNS: ED Triage Vitals  Enc Vitals Group     BP 12/11/15 0607 (!) 140/119     Pulse Rate 12/11/15 0607 93     Resp 12/11/15 0607 (!) 24     Temp 12/11/15 0607 98.6 F (37 C)     Temp Source 12/11/15 0607 Oral     SpO2 12/11/15 0607 97 %     Weight 12/11/15 0608 245 lb (111.1 kg)     Height 12/11/15 0608 6' (1.829 m)     Head Circumference  --      Peak Flow --      Pain Score 12/11/15 0608 10     Pain Loc --      Pain Edu? --      Excl. in [place]? --      Constitutional: Alert and oriented. Well appearing and in no distress. HEENT   Head: Normocephalic and atraumatic.      Eyes: Conjunctivae are normal. PERRL. Normal extraocular movements.      Ears:         Nose: No congestion/rhinnorhea.   Mouth/Throat: Mucous membranes are moist.   Neck: No stridor. Cardiovascular/Chest: Normal peripheral Capillary Refill.Marland Kitchen Respiratory: Normal respiratory effort without tachypnea nor retractions.  Gastrointestinal: No distention. Genitourinary/rectal:Deferred Musculoskeletal: Right knee with fairly large effusion which is tender to palpation and somewhat limiting his range of motion due to pain. Neurologic:  Normal speech and language. No gross or focal neurologic deficits are appreciated. Skin:  Skin is warm, dry and intact. No rash noted. Psychiatric: Mood and affect are normal. Speech and behavior are normal. Patient exhibits appropriate insight and judgment.   ____________________________________________  LABS (pertinent positives/negatives)  Labs Reviewed  SYNOVIAL CELL COUNT + DIFF, W/ CRYSTALS - Abnormal; Notable for the following:       Result Value   Color, Synovial YELLOW (*)    Appearance-Synovial CLOUDY (*)    WBC, Synovial 26,905 (*)    All other components within normal limits  BODY FLUID CULTURE  INTRACELLULAR MONOSODIUM URATE CRYSTALS   ____________________________________________  RADIOLOGY All Xrays were viewed by me. Imaging interpreted by Radiologist.  None __________________________________________  PROCEDURES  Procedure(s) performed:  Right knee joint arthrocentesis.  Indication: Therapeutic and diagnostic for right knee effusion Performed by myself, Dr. Reita Cliche M.D.  Skin cleaned with Betadine. Sterile technique. Local numbing with lidocaine 2% with epinephrine. Approach inferior  lateral.  Approximately 60 cc of yellow slightly cloudy synovial fluid removed. No complication.    Critical Care performed: None  ____________________________________________   ED COURSE / ASSESSMENT AND PLAN  Pertinent labs & imaging results that were available during my care of the patient were reviewed by me and considered in my medical decision making (see chart for details).    Mr. Minchew does have a knee joint effusion today and I discussed treatment for presumptive gout given his history of similar, versus therapeutic and diagnostic arthrocentesis. After discussion of risks and benefit, patient did give verbal consent for right knee arthrocentesis. Studies were sent.  Patient went home prior to results.  I did call him around 4 PM to let him know the results of the synovial fluid which is consistent with gouty effusion.    CONSULTATIONS:   None  Patient / Family / Caregiver informed of clinical course, medical decision-making process, and agree with plan.   I discussed return precautions, follow-up instructions, and discharge instructions with patient and/or family.   ___________________________________________   FINAL CLINICAL IMPRESSION(S) /  ED DIAGNOSES   Final diagnoses:  Acute gout of right knee, unspecified cause  Knee pain, acute, right              Note: This dictation was prepared with Dragon dictation. Any transcriptional errors that result from this process are unintentional    Lisa Roca, MD 12/11/15 1207

## 2015-12-11 NOTE — Discharge Instructions (Signed)
You were evaluated for right knee swelling called effusion, and likely gout. Fluid was drawn off in the emergency department, and he'll be called with the result.  Return to the emergency room for any fever, redness, skin rash, worsening pain, numbness or tingling, or any other symptoms concerning to you.

## 2015-12-11 NOTE — ED Triage Notes (Signed)
Patient presents with c/o RIGHT knee pain since Thursday morning. Patient reports that he was seen by [facility] yesterday and had U/S done; negative for DVT. (+) PMH for gout; feels the same.

## 2015-12-11 NOTE — ED Notes (Addendum)
Pt reports right knee is swollen and painful since yesterday - he was seen in the urgent care yesterday and told that it was ligament pain but pt believes that since the swelling started worse over night that the pain/swelling could be related to gout - pt does have a history of gout - pt states that he had u/s at urgent care yesterday and no blood clots were found - pt was given rx for etodolac 500mg  but it does not relieve the pain

## 2015-12-13 DIAGNOSIS — M109 Gout, unspecified: Secondary | ICD-10-CM | POA: Diagnosis not present

## 2015-12-13 DIAGNOSIS — M25461 Effusion, right knee: Secondary | ICD-10-CM | POA: Diagnosis not present

## 2015-12-14 LAB — BODY FLUID CULTURE: Culture: NO GROWTH

## 2015-12-22 DIAGNOSIS — L03011 Cellulitis of right finger: Secondary | ICD-10-CM | POA: Diagnosis not present

## 2015-12-28 DIAGNOSIS — M9901 Segmental and somatic dysfunction of cervical region: Secondary | ICD-10-CM | POA: Diagnosis not present

## 2015-12-28 DIAGNOSIS — M62830 Muscle spasm of back: Secondary | ICD-10-CM | POA: Diagnosis not present

## 2015-12-28 DIAGNOSIS — M9903 Segmental and somatic dysfunction of lumbar region: Secondary | ICD-10-CM | POA: Diagnosis not present

## 2015-12-28 DIAGNOSIS — M5416 Radiculopathy, lumbar region: Secondary | ICD-10-CM | POA: Diagnosis not present

## 2015-12-31 DIAGNOSIS — M5416 Radiculopathy, lumbar region: Secondary | ICD-10-CM | POA: Diagnosis not present

## 2015-12-31 DIAGNOSIS — M9903 Segmental and somatic dysfunction of lumbar region: Secondary | ICD-10-CM | POA: Diagnosis not present

## 2015-12-31 DIAGNOSIS — M62830 Muscle spasm of back: Secondary | ICD-10-CM | POA: Diagnosis not present

## 2015-12-31 DIAGNOSIS — M9901 Segmental and somatic dysfunction of cervical region: Secondary | ICD-10-CM | POA: Diagnosis not present

## 2016-02-14 ENCOUNTER — Encounter: Payer: Self-pay | Admitting: Family Medicine

## 2016-02-14 ENCOUNTER — Ambulatory Visit (INDEPENDENT_AMBULATORY_CARE_PROVIDER_SITE_OTHER): Payer: BLUE CROSS/BLUE SHIELD | Admitting: Family Medicine

## 2016-02-14 ENCOUNTER — Telehealth: Payer: Self-pay | Admitting: Family Medicine

## 2016-02-14 ENCOUNTER — Telehealth: Payer: Self-pay

## 2016-02-14 VITALS — BP 130/78 | HR 83 | Temp 98.8°F | Resp 16 | Wt 249.0 lb

## 2016-02-14 DIAGNOSIS — M79671 Pain in right foot: Secondary | ICD-10-CM | POA: Diagnosis not present

## 2016-02-14 DIAGNOSIS — E790 Hyperuricemia without signs of inflammatory arthritis and tophaceous disease: Secondary | ICD-10-CM | POA: Insufficient documentation

## 2016-02-14 DIAGNOSIS — Z8739 Personal history of other diseases of the musculoskeletal system and connective tissue: Secondary | ICD-10-CM

## 2016-02-14 MED ORDER — COLCHICINE 0.6 MG PO TABS: ORAL_TABLET | ORAL | 1 refills | Status: DC

## 2016-02-14 MED ORDER — COLCHICINE 0.6 MG PO TABS: ORAL_TABLET | ORAL | 0 refills | Status: DC

## 2016-02-14 NOTE — Telephone Encounter (Signed)
Patient was notified and scheduled ov for 3:30 today.

## 2016-02-14 NOTE — Progress Notes (Signed)
Patient: Gilbert Lee Male    DOB: 1965/12/06   50 y.o.   MRN: [id number] Visit Date: 02/14/2016  Today's Provider: Lelon Huh, MD   Chief Complaint  Patient presents with  . Gout   Subjective:    Patient states that he has been having bilateral foot pain on and off for the last 2 weeks. Patient stated that the pain is in his heel and the side of his feet. Patient had one incident when his right was swollen for 3 days. Patient has been taking taking Allopurinol  100 mg.   He has history of gout and hyperuricemia Lab Results  Component Value Date   LABURIC 9.9 (H) 10/11/2015   He had been attempting to control uric acid levels with diet. But has had a few severe attacks the last few months, twice requiring visit to ER for knee aspirations, with uric acid crystals seen in aspirated. Over the last few weeks he has been having migrating pain and swelling in right ankle and foot for which he was taking indomethacin providing some relief. He started taking allopurinol last week because he thought he was having a gout attack, but pains have only gotten worse.      No Known Allergies   Current Outpatient Prescriptions:  .  aspirin 81 MG tablet, Take 81 mg by mouth daily., Disp: , Rfl:  .  b complex vitamins tablet, Take 1 tablet by mouth daily., Disp: , Rfl:  .  etodolac (LODINE) 500 MG tablet, Take 500 mg by mouth 2 (two) times daily., Disp: , Rfl:  .  HYDROcodone-acetaminophen (NORCO/VICODIN) 5-325 MG tablet, Take 1 tablet by mouth every 6 (six) hours as needed for moderate pain., Disp: 5 tablet, Rfl: 0 .  magnesium 30 MG tablet, Take 30 mg by mouth 2 (two) times daily., Disp: , Rfl:  .  TURMERIC PO, Take 1 capsule by mouth daily. , Disp: , Rfl:  .  allopurinol (ZYLOPRIM) 100 MG tablet, Take 1 tablet by mouth daily., Disp: , Rfl: 5 .  Misc Natural Products (TART CHERRY ADVANCED) CAPS, Take 1 capsule by mouth daily. , Disp: , Rfl:   Review of Systems  Constitutional:  Negative for appetite change, chills and fever.  Respiratory: Negative for chest tightness, shortness of breath and wheezing.   Cardiovascular: Negative for chest pain and palpitations.  Gastrointestinal: Negative for abdominal pain, nausea and vomiting.    Social History  Substance Use Topics  . Smoking status: Never Smoker  . Smokeless tobacco: Never Used  . Alcohol use 0.6 oz/week    1 Glasses of wine per week   Objective:   BP 130/78 (BP Location: Left Arm, Patient Position: Sitting, Cuff Size: Large)   Pulse 83   Temp 98.8 F (37.1 C) (Oral)   Resp 16   Wt 249 lb (112.9 kg)   SpO2 97%   BMI 33.77 kg/m   Physical Exam   General Appearance:    Alert, cooperative, no distress  Eyes:    PERRL, conjunctiva/corneas clear, EOM's intact       Lungs:     Clear to auscultation bilaterally, respirations unlabored  Heart:    Regular rate and rhythm  Neurologic:   Awake, alert, oriented x 3. No apparent focal neurological           defect.   MS:   Slight tenderness plantar aspect of right midfoot and lateral aspect of ankle. No redness or swelling in toe.  Assessment & Plan:     1. Right foot pain I think this is more mechanical, likely tendonitis or plantar fasciitis and should respond to indomethacin. Advised he may benefit from podiatry evaluation if not.   2. History of gout   3. Hyperuricemia He states he doesn't feel like he can manage this with diet changes alone and would like to take allopurinol. Advised that this medication can aggravate acute gout flares, although I don't think his current pain is actually due to gout. He is to start colchicine and take along with allopurinol until uric acid levels normalize. He will contact us after the first of the year to check uric levels.    The entirety of the information documented in the History of Present Illness, Review of Systems and Physical Exam were personally obtained by me. Portions of this information were  initially documented by April M. Sabra Heck, CMA and reviewed by me for thoroughness and accuracy.        Lelon Huh, MD  [facility]

## 2016-02-14 NOTE — Patient Instructions (Signed)
Call for order to check uric acid level after a month

## 2016-02-14 NOTE — Telephone Encounter (Signed)
Allopurinol is to prevent gout attacks, but can make attacks worse if started in the middle of one. He should schedule an appointment to have this looked at.

## 2016-02-14 NOTE — Telephone Encounter (Signed)
Patient called office stating that he believes that he has a gout flare up. Patient states that he has a history of gout and has been controlling it with weight and lifestyle modifications to prevent another gout attack. Patient reports that he recently started taking Indomethacin a week ago when his most recent attack occurred. Patient reports that medication was not helping with pain so he went to pharmacy on 02/11/16 and refilled prescription for Allopurinol which was discontinued in chart on 12/11/15. Patient wanted to know if Allopurinol could make his symptoms worse? because pain has stayed constant, patient states that he stopped taking Indomethacin 50mg  on 02/11/16 when he started the Allopurinol. I advised patient that an office visit would most likely be need to assess his condition. Patient wanted to know Dr. Caryn Section opinion first. Amparo Bristol

## 2016-02-14 NOTE — Telephone Encounter (Signed)
error 

## 2016-03-16 ENCOUNTER — Other Ambulatory Visit: Payer: Self-pay | Admitting: Family Medicine

## 2016-06-09 DIAGNOSIS — M1712 Unilateral primary osteoarthritis, left knee: Secondary | ICD-10-CM | POA: Diagnosis not present

## 2016-06-09 DIAGNOSIS — M25562 Pain in left knee: Secondary | ICD-10-CM | POA: Diagnosis not present

## 2016-06-09 DIAGNOSIS — M25462 Effusion, left knee: Secondary | ICD-10-CM | POA: Diagnosis not present

## 2016-07-08 ENCOUNTER — Other Ambulatory Visit: Payer: Self-pay | Admitting: Family Medicine

## 2016-10-30 ENCOUNTER — Emergency Department
Admission: EM | Admit: 2016-10-30 | Discharge: 2016-10-30 | Disposition: A | Discharge status: Home / Self Care | Payer: BLUE CROSS/BLUE SHIELD | Attending: Emergency Medicine | Admitting: Emergency Medicine

## 2016-10-30 ENCOUNTER — Encounter: Payer: Self-pay | Admitting: *Deleted

## 2016-10-30 DIAGNOSIS — Z79899 Other long term (current) drug therapy: Secondary | ICD-10-CM | POA: Diagnosis not present

## 2016-10-30 DIAGNOSIS — M25461 Effusion, right knee: Secondary | ICD-10-CM | POA: Diagnosis not present

## 2016-10-30 DIAGNOSIS — J45909 Unspecified asthma, uncomplicated: Secondary | ICD-10-CM | POA: Insufficient documentation

## 2016-10-30 DIAGNOSIS — M25561 Pain in right knee: Secondary | ICD-10-CM | POA: Diagnosis present

## 2016-10-30 DIAGNOSIS — M10061 Idiopathic gout, right knee: Secondary | ICD-10-CM | POA: Diagnosis not present

## 2016-10-30 DIAGNOSIS — Z7982 Long term (current) use of aspirin: Secondary | ICD-10-CM | POA: Diagnosis not present

## 2016-10-30 DIAGNOSIS — M1A0610 Idiopathic chronic gout, right knee, without tophus (tophi): Secondary | ICD-10-CM | POA: Insufficient documentation

## 2016-10-30 MED ORDER — HYDROCODONE-ACETAMINOPHEN 5-325 MG PO TABS
1.0000 | ORAL_TABLET | Freq: Once | ORAL | Status: AC
  Administered 2016-10-30: 1 via ORAL
  Filled 2016-10-30: qty 1

## 2016-10-30 MED ORDER — COLCHICINE 0.6 MG PO TABS
1.2000 mg | ORAL_TABLET | Freq: Once | ORAL | Status: AC
  Administered 2016-10-30: 1.2 mg via ORAL
  Filled 2016-10-30: qty 2

## 2016-10-30 MED ORDER — COLCHICINE 0.6 MG PO TABS: ORAL_TABLET | ORAL | 0 refills | Status: DC

## 2016-10-30 NOTE — Discharge Instructions (Signed)
Take the prescription meds as directed. Follow-up with Dr. Harlow Mares as planned.

## 2016-10-30 NOTE — ED Triage Notes (Signed)
Pt to triage via wheelchair.  Pt has swelling and pain to right knee.  Hx of gout.  Pt alert.

## 2016-10-30 NOTE — ED Notes (Signed)
Pt discharged to home.  Family member driving.  Discharge instructions reviewed.  Verbalized understanding.  No questions or concerns at this time.  Teach back verified.  Pt in NAD.  No items left in ED.   

## 2016-10-31 ENCOUNTER — Other Ambulatory Visit
Admission: RE | Admit: 2016-10-31 | Discharge: 2016-10-31 | Disposition: A | Discharge status: Home / Self Care | Payer: BLUE CROSS/BLUE SHIELD | Source: Ambulatory Visit | Attending: Orthopedic Surgery | Admitting: Orthopedic Surgery

## 2016-10-31 DIAGNOSIS — M25461 Effusion, right knee: Secondary | ICD-10-CM | POA: Diagnosis not present

## 2016-10-31 DIAGNOSIS — M25462 Effusion, left knee: Secondary | ICD-10-CM | POA: Diagnosis present

## 2016-10-31 DIAGNOSIS — M10061 Idiopathic gout, right knee: Secondary | ICD-10-CM | POA: Diagnosis not present

## 2016-10-31 LAB — SYNOVIAL CELL COUNT + DIFF, W/ CRYSTALS
EOSINOPHILS-SYNOVIAL: 0 %
Lymphocytes-Synovial Fld: 7 %
Monocyte-Macrophage-Synovial Fluid: 0 %
NEUTROPHIL, SYNOVIAL: 93 %
OTHER CELLS-SYN: 0
WBC, Synovial: 44377 /mm3 — ABNORMAL HIGH (ref 0–200)

## 2016-10-31 NOTE — ED Provider Notes (Signed)
[facility] Emergency Department Provider Note ____________________________________________  Time seen: 2235  I have reviewed the triage vital signs and the nursing notes.  HISTORY  Chief Complaint  Knee Pain  HPI Gilbert Lee is a 51 y.o. male presents to the ED for evaluation of right knee pain and swelling. He was seen by Dr. Harlow Lee at [facility] earlier today for the same complaint. He notes that Gilbert Lee was going to draw fluid off of the knee, to screen for gout. The patient thought that his symptoms were due to a sprain. Together they decided, instead, to start on Ultram and indocin. He now believes now, that he is experiencing a gout flare. He presents now with increased swelling, pain, and disability to the knee,after wearing a knee sleeve. He takes allopurinol daily and was previously dosed colchicine daily for about 30 days.   Past Medical History:  Diagnosis Date  . Asthma   . Gout     Patient Active Problem List   Diagnosis Date Noted  . Hyperuricemia 02/14/2016  . Benign neoplasm of transverse colon   . Benign neoplasm of sigmoid colon   . Allergic rhinitis 09/06/2015  . 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 Family history of malignant neoplasm of prostate 09/06/2015  . 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 H/O hypercholesterolemia 09/06/2015  . Hemorrhoids, internal 09/06/2015  . Adiposity 09/06/2015    Past Surgical History:  Procedure Laterality Date  . COLONOSCOPY WITH PROPOFOL N/A 11/11/2015   Procedure: COLONOSCOPY WITH PROPOFOL;  Surgeon: Lucilla Lame, MD;  Location: [place];  Service: Endoscopy;  Laterality: N/A;  . KNEE ARTHROSCOPY Right   . POLYPECTOMY N/A 11/11/2015   Procedure: POLYPECTOMY;  Surgeon: Lucilla Lame, MD;  Location: [place];  Service: Endoscopy;  Laterality: N/A;    Prior to Admission medications   Medication Sig Start Date End Date Taking? Authorizing Provider  allopurinol (ZYLOPRIM) 100 MG tablet TAKE  1 TABLET(100 MG) BY MOUTH DAILY 07/08/16   Birdie Sons, MD  aspirin 81 MG tablet Take 81 mg by mouth daily.    [provider]  b complex vitamins tablet Take 1 tablet by mouth daily.    [provider]  colchicine 0.6 MG tablet Take 2 tabs PO x 1, then 1 tab PO 1 hour later x 1  Max: 1.8 mg total dose per attack, do not repeat within 3 days. 10/30/16   Trejon Duford, Dannielle Karvonen, PA-C  etodolac (LODINE) 500 MG tablet Take 500 mg by mouth 2 (two) times daily.    [provider]  HYDROcodone-acetaminophen (NORCO/VICODIN) 5-325 MG tablet Take 1 tablet by mouth every 6 (six) hours as needed for moderate pain. 12/11/15   Lisa Roca, MD  magnesium 30 MG tablet Take 30 mg by mouth 2 (two) times daily.    [provider]  Misc Natural Products (TART CHERRY ADVANCED) CAPS Take 1 capsule by mouth daily.     [provider]  TURMERIC PO Take 1 capsule by mouth daily.     [provider]    Allergies Patient has no known allergies.  Family History  Problem Relation Age of Onset  . Other Father        intestinal obstruction  . Prostate cancer Father   . Parkinsonism Mother   . Deep vein thrombosis Brother   . Deep vein thrombosis Brother     Social History Social History  Substance Use Topics  . Smoking status: Never Smoker  . Smokeless  tobacco: Never Used  . Alcohol use 0.6 oz/week    1 Glasses of wine per week    Review of Systems  Constitutional: Negative for fever. Cardiovascular: Negative for chest pain. Respiratory: Negative for shortness of breath. Musculoskeletal: Negative for back pain. Right knee pain & swelling as above Skin: Negative for rash. ____________________________________________  PHYSICAL EXAM:  VITAL SIGNS: ED Triage Vitals  Enc Vitals Group     BP 10/30/16 2048 136/76     Pulse Rate 10/30/16 2046 92     Resp 10/30/16 2046 20     Temp 10/30/16 2046 98.7 F (37.1 C)     Temp Source 10/30/16 2046 Oral      SpO2 10/30/16 2046 99 %     Weight 10/30/16 2047 250 lb (113.4 kg)     Height 10/30/16 2047 6' (1.829 m)     Head Circumference --      Peak Flow --      Pain Score 10/30/16 2046 10     Pain Loc --      Pain Edu? --      Excl. in [place]? --     Constitutional: Alert and oriented. Well appearing and in no distress. Head: Normocephalic and atraumatic. Eyes: Conjunctivae are normal. . Normal extraocular movements Cardiovascular: Normal rate, regular rhythm. Normal distal pulses. Respiratory: Normal respiratory effort. No wheezes/rales/rhonchi. Musculoskeletal: Right knee with a moderate effusion. Decreased ROM due to pain and effusion. Nontender with normal range of motion in all other extremities.  Neurologic:  Normal gross sensation. Normal speech and language. No gross focal neurologic deficits are appreciated. Skin:  Skin is warm, dry and intact. No rash noted. ____________________________________________  PROCEDURES  Colchrys 1.2 mg PO Norco 5-325 mg PO ____________________________________________  INITIAL IMPRESSION / ASSESSMENT AND PLAN / ED COURSE  Patient with right knee effusion, likely due to an acute gout flare. He is discharged with a prescription for colchicine to dose for acute attacks. He will follow-up with Dr. Harlow Lee for joint aspiration tomorrow, if needed. Rest, ice, and elevate the knee as needed.  ____________________________________________  FINAL CLINICAL IMPRESSION(S) / ED DIAGNOSES  Final diagnoses:  Effusion of right knee  Idiopathic chronic gout of right knee without tophus     Carmie End, Dannielle Karvonen, PA-C 10/31/16 2157    Carrie Mew, MD 11/05/16 2023

## 2016-11-03 LAB — BODY FLUID CULTURE: Culture: NO GROWTH

## 2016-12-18 ENCOUNTER — Encounter: Payer: Self-pay | Admitting: Emergency Medicine

## 2016-12-18 ENCOUNTER — Telehealth: Payer: Self-pay | Admitting: Family Medicine

## 2016-12-18 ENCOUNTER — Emergency Department: Payer: BLUE CROSS/BLUE SHIELD

## 2016-12-18 ENCOUNTER — Emergency Department
Admission: EM | Admit: 2016-12-18 | Discharge: 2016-12-18 | Disposition: A | Discharge status: Home / Self Care | Payer: BLUE CROSS/BLUE SHIELD | Attending: Emergency Medicine | Admitting: Emergency Medicine

## 2016-12-18 DIAGNOSIS — M25561 Pain in right knee: Secondary | ICD-10-CM | POA: Diagnosis not present

## 2016-12-18 DIAGNOSIS — M109 Gout, unspecified: Secondary | ICD-10-CM | POA: Diagnosis not present

## 2016-12-18 DIAGNOSIS — M10061 Idiopathic gout, right knee: Secondary | ICD-10-CM | POA: Diagnosis not present

## 2016-12-18 DIAGNOSIS — J45909 Unspecified asthma, uncomplicated: Secondary | ICD-10-CM | POA: Diagnosis not present

## 2016-12-18 DIAGNOSIS — Z79899 Other long term (current) drug therapy: Secondary | ICD-10-CM | POA: Insufficient documentation

## 2016-12-18 DIAGNOSIS — Z7982 Long term (current) use of aspirin: Secondary | ICD-10-CM | POA: Insufficient documentation

## 2016-12-18 DIAGNOSIS — R9431 Abnormal electrocardiogram [ECG] [EKG]: Secondary | ICD-10-CM | POA: Diagnosis not present

## 2016-12-18 LAB — BASIC METABOLIC PANEL
ANION GAP: 9 (ref 5–15)
BUN: 24 mg/dL — ABNORMAL HIGH (ref 6–20)
CO2: 26 mmol/L (ref 22–32)
Calcium: 8.9 mg/dL (ref 8.9–10.3)
Chloride: 105 mmol/L (ref 101–111)
Creatinine, Ser: 1.26 mg/dL — ABNORMAL HIGH (ref 0.61–1.24)
GFR calc Af Amer: >60 mL/min (ref >60)
GFR calc non Af Amer: >60 mL/min (ref >60)
GLUCOSE: 195 mg/dL — AB (ref 65–99)
POTASSIUM: 4 mmol/L (ref 3.5–5.1)
Sodium: 140 mmol/L (ref 135–145)

## 2016-12-18 LAB — CBC
HEMATOCRIT: 40.1 % (ref 40.0–52.0)
Hemoglobin: 13.6 g/dL (ref 13.0–18.0)
MCH: 29 pg (ref 26.0–34.0)
MCHC: 33.8 g/dL (ref 32.0–36.0)
MCV: 85.8 fL (ref 80.0–100.0)
Platelets: 198 10*3/uL (ref 150–440)
RBC: 4.68 MIL/uL (ref 4.40–5.90)
RDW: 13.5 % (ref 11.5–14.5)
WBC: 12.5 10*3/uL — ABNORMAL HIGH (ref 3.8–10.6)

## 2016-12-18 LAB — URIC ACID: Uric Acid, Serum: 8 mg/dL — ABNORMAL HIGH (ref 4.4–7.6)

## 2016-12-18 LAB — TROPONIN I: Troponin I: <0.03 ng/mL (ref <0.03)

## 2016-12-18 MED ORDER — FENTANYL CITRATE (PF) 100 MCG/2ML IJ SOLN
50.0000 ug | Freq: Once | INTRAMUSCULAR | Status: AC
  Administered 2016-12-18: 50 ug via INTRAVENOUS
  Filled 2016-12-18: qty 2

## 2016-12-18 MED ORDER — METHYLPREDNISOLONE SODIUM SUCC 125 MG IJ SOLR
125.0000 mg | Freq: Once | INTRAMUSCULAR | Status: AC
  Administered 2016-12-18: 125 mg via INTRAVENOUS
  Filled 2016-12-18: qty 2

## 2016-12-18 MED ORDER — INDOMETHACIN 50 MG PO CAPS: 50.0000 mg | ORAL_CAPSULE | Freq: Three times a day (TID) | ORAL | 0 refills | Status: DC | PRN

## 2016-12-18 MED ORDER — PREDNISONE 20 MG PO TABS: 60.0000 mg | ORAL_TABLET | Freq: Every day | ORAL | 0 refills | Status: DC

## 2016-12-18 MED ORDER — SODIUM CHLORIDE 0.9 % IV BOLUS (SEPSIS)
1000.0000 mL | Freq: Once | INTRAVENOUS | Status: AC
  Administered 2016-12-18: 1000 mL via INTRAVENOUS

## 2016-12-18 MED ORDER — FENTANYL CITRATE (PF) 100 MCG/2ML IJ SOLN
50.0000 ug | Freq: Once | INTRAMUSCULAR | Status: AC
Start: 2016-12-18 — End: 2016-12-18
  Administered 2016-12-18: 50 ug via INTRAVENOUS

## 2016-12-18 MED ORDER — ALLOPURINOL 300 MG PO TABS: 300.0000 mg | ORAL_TABLET | Freq: Every day | ORAL | 0 refills | Status: DC

## 2016-12-18 NOTE — ED Notes (Signed)
Pt sleeping upon entering room, easily arousable to verbal stimuli.

## 2016-12-18 NOTE — Telephone Encounter (Signed)
Have sent in prescription for allopurinol 300mg  a day. HOWEVER, please remind patient that the allopurinol will not help acute gout flare at all, and he will need to continue the indomethacin or colchicine to help with that. Allopurinol is just to lower uric acid level to prevent flares in the future.   He needs to schedule follow up in 3-4 weeks.

## 2016-12-18 NOTE — ED Notes (Signed)
ED Provider at bedside. 

## 2016-12-18 NOTE — ED Provider Notes (Signed)
[facility] Emergency Department Provider Note   ____________________________________________   First MD Initiated Contact with Patient 12/18/16 [phone]     (approximate)  I have reviewed the triage vital signs and the nursing notes.   HISTORY  Chief Complaint Knee Pain    HPI Gilbert Lee is a 51 y.o. male Who comes into the hospital today with some severe right knee pain. The patient has a history of gout reports that this is his gout pain.the patient reports that he has been having knee pain for the past 2-3 days. He reports though that is gotten severe tonight. He states that he's tried taking medication but is not letting up. He feels that someone is stabbing him in his right knee. He took 2 colchicine at 9:30 and then another one at 11:30. The patient took some hydrocodone and has been using indomethacin and allopurinol as well. The patient reports that his indomethacin was expired.The patient reports that he had his knee drained a few weeks ago when it was a gouty effusion. The patient reports this pain is a 10 out of 10 in intensity. He is here today for evaluation.   Past Medical History:  Diagnosis Date  . Asthma   . Gout     Patient Active Problem List   Diagnosis Date Noted  . Hyperuricemia 02/14/2016  . Benign neoplasm of transverse colon   . Benign neoplasm of sigmoid colon   . Allergic rhinitis 09/06/2015  . 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 Family history of malignant neoplasm of prostate 09/06/2015  . 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 H/O hypercholesterolemia 09/06/2015  . Hemorrhoids, internal 09/06/2015  . Adiposity 09/06/2015    Past Surgical History:  Procedure Laterality Date  . COLONOSCOPY WITH PROPOFOL N/A 11/11/2015   Procedure: COLONOSCOPY WITH PROPOFOL;  Surgeon: Lucilla Lame, MD;  Location: [place];  Service: Endoscopy;  Laterality: N/A;  . KNEE ARTHROSCOPY Right   . POLYPECTOMY N/A 11/11/2015   Procedure:  POLYPECTOMY;  Surgeon: Lucilla Lame, MD;  Location: [place];  Service: Endoscopy;  Laterality: N/A;    Prior to Admission medications   Medication Sig Start Date End Date Taking? Authorizing Provider  allopurinol (ZYLOPRIM) 100 MG tablet TAKE 1 TABLET(100 MG) BY MOUTH DAILY 07/08/16   Birdie Sons, MD  aspirin 81 MG tablet Take 81 mg by mouth daily.    [provider]  b complex vitamins tablet Take 1 tablet by mouth daily.    [provider]  colchicine 0.6 MG tablet Take 2 tabs PO x 1, then 1 tab PO 1 hour later x 1  Max: 1.8 mg total dose per attack, do not repeat within 3 days. 10/30/16   Menshew, Dannielle Karvonen, PA-C  etodolac (LODINE) 500 MG tablet Take 500 mg by mouth 2 (two) times daily.    [provider]  HYDROcodone-acetaminophen (NORCO/VICODIN) 5-325 MG tablet Take 1 tablet by mouth every 6 (six) hours as needed for moderate pain. 12/11/15   Lisa Roca, MD  indomethacin (INDOCIN) 50 MG capsule Take 1 capsule (50 mg total) by mouth 3 (three) times daily as needed. 12/18/16   Loney Hering, MD  magnesium 30 MG tablet Take 30 mg by mouth 2 (two) times daily.    [provider]  Misc Natural Products (TART CHERRY ADVANCED) CAPS Take 1 capsule by mouth daily.     [provider]  predniSONE (DELTASONE) 20 MG tablet Take 3 tablets (60 mg total)  by mouth daily. 12/18/16   Loney Hering, MD  TURMERIC PO Take 1 capsule by mouth daily.     [provider]    Allergies Patient has no known allergies.  Family History  Problem Relation Age of Onset  . Other Father        intestinal obstruction  . Prostate cancer Father   . Parkinsonism Mother   . Deep vein thrombosis Brother   . Deep vein thrombosis Brother     Social History Social History  Substance Use Topics  . Smoking status: Never Smoker  . Smokeless tobacco: Never Used  . Alcohol use 0.6 oz/week    1 Glasses of wine per week    Review of  Systems  Constitutional: No fever/chills Eyes: No visual changes. ENT: No sore throat. Cardiovascular: Denies chest pain. Respiratory: Denies shortness of breath. Gastrointestinal: No abdominal pain.  No nausea, no vomiting.  No diarrhea.  No constipation. Genitourinary: Negative for dysuria. Musculoskeletal: right knee pain Skin: Negative for rash. Neurological: Negative for headaches, focal weakness or numbness.   ____________________________________________   PHYSICAL EXAM:  VITAL SIGNS: ED Triage Vitals  Enc Vitals Group     BP 12/18/16 0540 99/71     Pulse Rate 12/18/16 0453 60     Resp 12/18/16 0453 16     Temp 12/18/16 0453 97.6 F (36.4 C)     Temp Source 12/18/16 0453 Oral     SpO2 12/18/16 0453 97 %     Weight 12/18/16 0500 245 lb (111.1 kg)     Height 12/18/16 0500 6' (1.829 m)     Head Circumference --      Peak Flow --      Pain Score 12/18/16 0453 10     Pain Loc --      Pain Edu? --      Excl. in [place]? --     Constitutional: Alert and oriented. Well appearing and in moderate distress. Eyes: Conjunctivae are normal. PERRL. EOMI. Head: Atraumatic. Nose: No congestion/rhinnorhea. Mouth/Throat: Mucous membranes are moist.  Oropharynx non-erythematous. Cardiovascular: Normal rate, regular rhythm. Grossly normal heart sounds.  Good peripheral circulation. Respiratory: Normal respiratory effort.  No retractions. Lungs CTAB. Gastrointestinal: Soft and nontender. No distention. positive bowel sounds Musculoskeletal: right knee pain to palpation and pains with passive range of motion  Neurologic:  Normal speech and language.  Skin:  Skin is warm, dry and intact.  Psychiatric: Mood and affect are normal.   ____________________________________________   LABS (all labs ordered are listed, but only abnormal results are displayed)  Labs Reviewed  CBC - Abnormal; Notable for the following:       Result Value   WBC 12.5 (*)    All other components within  normal limits  BASIC METABOLIC PANEL - Abnormal; Notable for the following:    Glucose, Bld 195 (*)    BUN 24 (*)    Creatinine, Ser 1.26 (*)    All other components within normal limits  URIC ACID - Abnormal; Notable for the following:    Uric Acid, Serum 8.0 (*)    All other components within normal limits  TROPONIN I   ____________________________________________  EKG  ED ECG REPORT I, Loney Hering, the attending physician, personally viewed and interpreted this ECG.   Date: 12/18/2016  EKG Time: 520  Rate: 60  Rhythm: normal sinus rhythm  Axis: normal  Intervals:none  ST&T Change: none  ____________________________________________  RADIOLOGY  Dg Knee Complete 4  Views Right  Result Date: 12/18/2016 CLINICAL DATA:  51 y/o M; stabbing right lateral knee pain increasing over the last 5 weeks. History of gout. EXAM: RIGHT KNEE - COMPLETE 4+ VIEW COMPARISON:  None. FINDINGS: No evidence of fracture, dislocation, or joint effusion. Spurring of tibial spines. Small superior patellar enthesophyte. No osseous erosion or soft tissue calcification. IMPRESSION: 1. No acute osseous abnormality. No osseous erosion or soft tissue calcification. No joint effusion. 2. Minimal degenerative changes of the knee joint. Electronically Signed   By: Kristine Garbe M.D.   On: 12/18/2016 06:40    ____________________________________________   PROCEDURES  Procedure(s) performed: None  Procedures  Critical Care performed: No  ____________________________________________   INITIAL IMPRESSION / ASSESSMENT AND PLAN / ED COURSE  Pertinent labs & imaging results that were available during my care of the patient were reviewed by me and considered in my medical decision making (see chart for details).  this is a 51 year old male who comes into the hospital today with some right knee pain. The patient has a history of gout and states that this feels just like his gout.  My  differential diagnosis includes gout or internal derangement of the knee.  The patient initially came in and was diaphoretic with some moderate blood pressures. I did order a liter of normal saline for the patient as well as some fentanyl and some blood work. The patient still had pains that he received a second dose of fentanyl and some Solu-Medrol. He does have an elevated uric acid level and his x-ray does not show any significant effusion or any bony distruction. The patient does have some minimal degenerative changes. After the Solu-Medrol and second dose than it had been. I discussed with the patient that I would put him on a prednisone taper and he stated that he would follow-up with his orthopedic surgeon. He did ask for another prescription for his indomethacin as his is expired. The patient will be discharged home to follow-up.      ____________________________________________   FINAL CLINICAL IMPRESSION(S) / ED DIAGNOSES  Final diagnoses:  Acute pain of right knee  Acute gout of right knee, unspecified cause      NEW MEDICATIONS STARTED DURING THIS VISIT:  Discharge Medication List as of 12/18/2016  7:45 AM    START taking these medications   Details  indomethacin (INDOCIN) 50 MG capsule Take 1 capsule (50 mg total) by mouth 3 (three) times daily as needed., Starting Mon 12/18/2016, Print    predniSONE (DELTASONE) 20 MG tablet Take 3 tablets (60 mg total) by mouth daily., Starting Mon 12/18/2016, Print         Note:  This document was prepared using Dragon voice recognition software and may include unintentional dictation errors.    Loney Hering, MD 12/18/16 [phone]

## 2016-12-18 NOTE — Discharge Instructions (Signed)
PLease follow up with your orthopedic surgeon. Please complete the prednisone taper prior to taking the indomethacin

## 2016-12-18 NOTE — Telephone Encounter (Signed)
Patient was notified and appt scheduled.

## 2016-12-18 NOTE — ED Notes (Signed)
Patient transported to X-ray 

## 2016-12-18 NOTE — Telephone Encounter (Signed)
Pt states she has has gout in his knee 2 times in the last 5 weeks.  Pt states he was seen at [facility] last night and had lab done.  Pt is asking if the Rx allopurinol (ZYLOPRIM) 100 MG tablet for can be increased? [employer].  [phone]

## 2016-12-18 NOTE — Telephone Encounter (Signed)
Please review. Thanks!  

## 2016-12-18 NOTE — ED Notes (Signed)
Pt alert and oriented X4, active, cooperative, pt in NAD. RR even and unlabored, color WNL.  Discharge and followup instructions reviewed. Pt informed to return if any life threatening symptoms occur.  Pt able to stand and pivot to get into wheelchair and into car.

## 2016-12-21 DIAGNOSIS — M25461 Effusion, right knee: Secondary | ICD-10-CM | POA: Diagnosis not present

## 2016-12-21 DIAGNOSIS — M10061 Idiopathic gout, right knee: Secondary | ICD-10-CM | POA: Diagnosis not present

## 2016-12-28 ENCOUNTER — Other Ambulatory Visit: Payer: Self-pay | Admitting: Family Medicine

## 2016-12-28 NOTE — Telephone Encounter (Signed)
Pharmacy stated that allopurinol 100 mg was on auto refill. Also they did fill allopurinol 300 mg.

## 2016-12-28 NOTE — Telephone Encounter (Signed)
Please contact [facility] regarding refill request for allopurinol 100mg . Dose was supposed to have been changed to 300mg  and prescription was sent on 12-18-2016. Please see if the 300mg  tablets were dispensed. If not can dispense as written 12-18-2016. Thanks.

## 2017-01-15 ENCOUNTER — Encounter: Payer: Self-pay | Admitting: Family Medicine

## 2017-01-15 ENCOUNTER — Ambulatory Visit (INDEPENDENT_AMBULATORY_CARE_PROVIDER_SITE_OTHER): Payer: BLUE CROSS/BLUE SHIELD | Admitting: Family Medicine

## 2017-01-15 VITALS — BP 130/80 | HR 90 | Temp 98.7°F | Resp 16 | Wt 251.0 lb

## 2017-01-15 DIAGNOSIS — E785 Hyperlipidemia, unspecified: Secondary | ICD-10-CM

## 2017-01-15 DIAGNOSIS — Z6833 Body mass index (BMI) 33.0-33.9, adult: Secondary | ICD-10-CM | POA: Diagnosis not present

## 2017-01-15 DIAGNOSIS — R739 Hyperglycemia, unspecified: Secondary | ICD-10-CM | POA: Diagnosis not present

## 2017-01-15 DIAGNOSIS — Z23 Encounter for immunization: Secondary | ICD-10-CM

## 2017-01-15 DIAGNOSIS — E790 Hyperuricemia without signs of inflammatory arthritis and tophaceous disease: Secondary | ICD-10-CM

## 2017-01-15 NOTE — Progress Notes (Signed)
Patient: Gilbert Lee Male    DOB: [birth year]   50 y.o.   MRN: [id number] Visit Date: 01/15/2017  Today's Provider: Lelon Huh, MD   Chief Complaint  Patient presents with  . Gout   Subjective:    HPI Follow up of Gout:  Patient was seen in the ER for this problem 3 weeks ago. After being discharged from the ER patient called here requesting that the Allopurinol dosage be increased. We increased Allopurinol to 300mg  daily and advised patient to continue taking either colchicine or Indomethacin, and to follow up in 3-4 weeks. Today patient comes in reporting good compliance with treatment, good tolerance and symptom control. Patient has been taking Allopurinol and Indomethacin as prescribed. He has not had any gout flare ups since the ER visit, although he has been having pain in his right knee and not sure if that is related to gout   Lab Results  Component Value Date   LABURIC 8.0 (H) 12/18/2016       No Known Allergies   Current Outpatient Prescriptions:  .  allopurinol (ZYLOPRIM) 300 MG tablet, Take 1 tablet (300 mg total) by mouth daily., Disp: 30 tablet, Rfl: 0 .  etodolac (LODINE) 500 MG tablet, Take 500 mg by mouth 2 (two) times daily., Disp: , Rfl:  .  indomethacin (INDOCIN) 50 MG capsule, Take 1 capsule (50 mg total) by mouth 3 (three) times daily as needed., Disp: 30 capsule, Rfl: 0 .  aspirin 81 MG tablet, Take 81 mg by mouth daily., Disp: , Rfl:  .  b complex vitamins tablet, Take 1 tablet by mouth daily., Disp: , Rfl:  .  colchicine 0.6 MG tablet, Take 2 tabs PO x 1, then 1 tab PO 1 hour later x 1  Max: 1.8 mg total dose per attack, do not repeat within 3 days. (Patient not taking: Reported on 01/15/2017), Disp: 10 tablet, Rfl: 0 .  magnesium 30 MG tablet, Take 30 mg by mouth 2 (two) times daily., Disp: , Rfl:  .  TURMERIC PO, Take 1 capsule by mouth daily. , Disp: , Rfl:   Review of Systems  Constitutional: Negative for appetite change, chills and  fever.  Respiratory: Negative for chest tightness, shortness of breath and wheezing.   Cardiovascular: Negative for chest pain and palpitations.  Gastrointestinal: Negative for abdominal pain, nausea and vomiting.  Musculoskeletal: Positive for arthralgias (pain in right knee).    Social History  Substance Use Topics  . Smoking status: Never Smoker  . Smokeless tobacco: Never Used  . Alcohol use No   Objective:   BP 130/80 (BP Location: Right Arm, Patient Position: Sitting, Cuff Size: Large)   Pulse 90   Temp 98.7 F (37.1 C) (Oral)   Resp 16   Wt 251 lb (113.9 kg)   SpO2 97% Comment: room air  BMI 34.04 kg/m  There were no vitals filed for this visit.   Physical Exam  General appearance: alert, well developed, well nourished, cooperative and in no distress Head: Normocephalic, without obvious abnormality, atraumatic Respiratory: Respirations even and unlabored, normal respiratory rate Extremities: No gross deformities Skin: Skin color, texture, turgor normal. No rashes seen  Psych: Appropriate mood and affect. Neurologic: Mental status: Alert, oriented to person, place, and time, thought content appropriate.     Assessment & Plan:     1. Hyperuricemia Is tolerating increased dose of allopurinol for last month. . Recheck - Uric acid  2. Hyperglycemia Glucose  Date Value Ref Range Status  10/11/2015 113 (H) 65 - 99 mg/dL Final   Glucose, Bld  Date Value Ref Range Status  12/18/2016 195 (H) 65 - 99 mg/dL Final   - Hemoglobin A1c  3. Hyperlipidemia, unspecified hyperlipidemia type He states he has cut way back on saturated fats and is requesting rechecking labs Lab Results  Component Value Date   CHOL 259 (H) 10/11/2015   HDL 37 (L) 10/11/2015   LDLCALC 154 (H) 10/11/2015   TRIG 340 (H) 10/11/2015   CHOLHDL 7.0 (H) 10/11/2015    - Lipid panel  4. Need for influenza vaccination  - Flu Vaccine QUAD 6+ mos PF IM (Fluarix Quad PF)  5. BMI  33.0-33.9,adult        Lelon Huh, MD  [facility]

## 2017-01-17 ENCOUNTER — Telehealth: Payer: Self-pay | Admitting: Family Medicine

## 2017-01-17 LAB — LIPID PANEL
CHOLESTEROL: 241 mg/dL — AB (ref <200)
HDL: 38 mg/dL — AB (ref >40)
LDL Cholesterol (Calc): 162 mg/dL (calc) — ABNORMAL HIGH
NON-HDL CHOLESTEROL (CALC): 203 mg/dL — AB (ref <130)
TRIGLYCERIDES: 249 mg/dL — AB (ref <150)
Total CHOL/HDL Ratio: 6.3 (calc) — ABNORMAL HIGH (ref <5.0)

## 2017-01-17 LAB — HEMOGLOBIN A1C
EAG (MMOL/L): 6 (calc)
Hgb A1c MFr Bld: 5.4 % of total Hgb (ref <5.7)
MEAN PLASMA GLUCOSE: 108 (calc)

## 2017-01-17 LAB — URIC ACID: Uric Acid, Serum: 6.6 mg/dL (ref 4.0–8.0)

## 2017-01-17 NOTE — Telephone Encounter (Signed)
Pt is requesting lab results.  GF#[phone]/[id number]

## 2017-01-18 ENCOUNTER — Telehealth: Payer: Self-pay

## 2017-01-18 NOTE — Telephone Encounter (Signed)
Pt advised of results. States he is already working on diet and exercise, and refuses statin at this time. He states he is already taking allopurinol 300 mg daily, and has been doing this x 3 weeks. Please advise.

## 2017-01-18 NOTE — Telephone Encounter (Signed)
-----   Message from Birdie Sons, MD sent at 01/17/2017  7:59 AM EDT ----- Cholesterol is about the same at 241. Sugar is normal. This is borderline for needing medications. If he thinks he can reduce saturated fats, sweets, and starches in diet we can get recheck next year. If he doesn't think he can make any significant diet improvements then should start statin.  Uric acid level is down to 6.6. Goal Is to get in under 6.0 recommend increase allopurinol to 2 x 100mg  daily #60, rf x 11

## 2017-01-18 NOTE — Telephone Encounter (Signed)
Ok. Just continue the 300mg  tablets higher doses usually have more side effects. Just reduce  High protein foods such as red meats, and avoid shellfish and alcohol as much as possible.

## 2017-01-18 NOTE — Telephone Encounter (Signed)
See result notes. 

## 2017-01-22 ENCOUNTER — Other Ambulatory Visit: Payer: Self-pay | Admitting: Family Medicine

## 2017-01-22 MED ORDER — ALLOPURINOL 300 MG PO TABS: 300.0000 mg | ORAL_TABLET | Freq: Every day | ORAL | 12 refills | Status: DC

## 2017-01-22 NOTE — Telephone Encounter (Addendum)
Pt contacted office for refill request on the following medications:  allopurinol (ZYLOPRIM) 300 MG tablet  Gilbert Lee.  [phone]  Pt states he is completely out and is requesting this sent today if possible

## 2017-01-29 DIAGNOSIS — M25462 Effusion, left knee: Secondary | ICD-10-CM | POA: Diagnosis not present

## 2017-02-16 DIAGNOSIS — M25562 Pain in left knee: Secondary | ICD-10-CM | POA: Diagnosis not present

## 2017-02-19 ENCOUNTER — Other Ambulatory Visit
Admission: RE | Admit: 2017-02-19 | Discharge: 2017-02-19 | Disposition: A | Discharge status: Home / Self Care | Payer: BLUE CROSS/BLUE SHIELD | Source: Ambulatory Visit | Attending: Student | Admitting: Student

## 2017-02-19 DIAGNOSIS — M109 Gout, unspecified: Secondary | ICD-10-CM | POA: Insufficient documentation

## 2017-02-19 LAB — SYNOVIAL CELL COUNT + DIFF, W/ CRYSTALS
Eosinophils-Synovial: 0 %
LYMPHOCYTES-SYNOVIAL FLD: 8 %
Monocyte-Macrophage-Synovial Fluid: 4 %
NEUTROPHIL, SYNOVIAL: 88 %
Other Cells-SYN: 0
WBC, SYNOVIAL: 30096 /mm3 — AB (ref 0–200)

## 2017-02-23 LAB — BODY FLUID CULTURE: CULTURE: NO GROWTH

## 2017-02-27 ENCOUNTER — Other Ambulatory Visit: Payer: Self-pay | Admitting: Orthopedic Surgery

## 2017-02-27 DIAGNOSIS — M25562 Pain in left knee: Secondary | ICD-10-CM

## 2017-03-02 ENCOUNTER — Ambulatory Visit: Payer: BLUE CROSS/BLUE SHIELD

## 2017-03-28 DIAGNOSIS — E669 Obesity, unspecified: Secondary | ICD-10-CM | POA: Insufficient documentation

## 2017-03-28 DIAGNOSIS — M1A00X0 Idiopathic chronic gout, unspecified site, without tophus (tophi): Secondary | ICD-10-CM | POA: Insufficient documentation

## 2017-03-28 DIAGNOSIS — Z79899 Other long term (current) drug therapy: Secondary | ICD-10-CM | POA: Diagnosis not present

## 2017-06-19 DIAGNOSIS — M1A00X0 Idiopathic chronic gout, unspecified site, without tophus (tophi): Secondary | ICD-10-CM | POA: Diagnosis not present

## 2017-06-19 DIAGNOSIS — Z79899 Other long term (current) drug therapy: Secondary | ICD-10-CM | POA: Diagnosis not present

## 2017-06-26 DIAGNOSIS — M1A00X0 Idiopathic chronic gout, unspecified site, without tophus (tophi): Secondary | ICD-10-CM | POA: Diagnosis not present

## 2017-06-26 DIAGNOSIS — Z79899 Other long term (current) drug therapy: Secondary | ICD-10-CM | POA: Diagnosis not present

## 2017-08-22 DIAGNOSIS — M1711 Unilateral primary osteoarthritis, right knee: Secondary | ICD-10-CM | POA: Diagnosis not present

## 2017-08-22 DIAGNOSIS — M25561 Pain in right knee: Secondary | ICD-10-CM | POA: Diagnosis not present

## 2017-08-22 DIAGNOSIS — G8929 Other chronic pain: Secondary | ICD-10-CM | POA: Diagnosis not present

## 2017-08-22 DIAGNOSIS — M25461 Effusion, right knee: Secondary | ICD-10-CM | POA: Diagnosis not present

## 2017-10-02 ENCOUNTER — Encounter: Payer: Self-pay | Admitting: Family Medicine

## 2017-10-02 ENCOUNTER — Ambulatory Visit (INDEPENDENT_AMBULATORY_CARE_PROVIDER_SITE_OTHER): Payer: BLUE CROSS/BLUE SHIELD | Admitting: Family Medicine

## 2017-10-02 VITALS — BP 132/72 | HR 75 | Temp 98.1°F | Resp 16 | Ht 72.0 in | Wt 242.0 lb

## 2017-10-02 DIAGNOSIS — M546 Pain in thoracic spine: Secondary | ICD-10-CM | POA: Diagnosis not present

## 2017-10-02 DIAGNOSIS — Z125 Encounter for screening for malignant neoplasm of prostate: Secondary | ICD-10-CM

## 2017-10-02 DIAGNOSIS — R739 Hyperglycemia, unspecified: Secondary | ICD-10-CM | POA: Diagnosis not present

## 2017-10-02 DIAGNOSIS — Z0000 Encounter for general adult medical examination without abnormal findings: Secondary | ICD-10-CM | POA: Diagnosis not present

## 2017-10-02 DIAGNOSIS — E785 Hyperlipidemia, unspecified: Secondary | ICD-10-CM | POA: Diagnosis not present

## 2017-10-02 NOTE — Progress Notes (Signed)
Patient: Gilbert Lee, Male    DOB: Aug 16, 1965, 52 y.o.   MRN: [id number] Visit Date: 10/02/2017  Today's Provider: Lelon Huh, MD   Chief Complaint  Patient presents with  . Annual Exam  . Hyperglycemia  . Hyperlipidemia   Subjective:    Annual physical exam LYNN SISSEL is a 52 y.o. male who presents today for health maintenance and complete physical. He feels fairly well. He reports no regular exercising. He reports he is sleeping fairly well.  -----------------------------------------------------------------   Lipid/Cholesterol, Follow-up:   Last seen for this 01/15/2017.  Management since that visit includes; labs checked. Recommended statin, but patient preferred to work on diet instead of taking medication.  Last Lipid Panel:    Component Value Date/Time   CHOL 241 (H) 01/16/2017 0813   CHOL 259 (H) 10/11/2015 0814   TRIG 249 (H) 01/16/2017 0813   HDL 38 (L) 01/16/2017 0813   HDL 37 (L) 10/11/2015 0814   CHOLHDL 6.3 (H) 01/16/2017 0813   LDLCALC 162 (H) 01/16/2017 0813    He reports poor compliance with treatment. He is not having side effects.   Wt Readings from Last 3 Encounters:  10/02/17 242 lb (109.8 kg)  01/15/17 251 lb (113.9 kg)  12/18/16 245 lb (111.1 kg)    ------------------------------------------------------------------------  Hyperuricemia From 01/15/2017-labs checked. Recommended that he continue taking 300 mg tablets of Allopurinol. Patient reports his Rheumatologist has increased Allopurinol to 400mg  daily.  He has had a lot of trouble with gout over the last year which he Lee has limited his activity. He is planning to start biking on a regular basis to try to lose weight.   Hyperglycemia From 01/15/2017-labs checked, no changes. Hemoglobin A1c 5.4. Patient reports he has not been consistent with diet and exercise.   He Lee he has occasional pain in his mid back that last several minutes then eases up. No specific  triggers. No exertional.      Review of Systems  Constitutional: Positive for activity change. Negative for appetite change, chills, fatigue and fever.  HENT: Negative for congestion, ear pain, hearing loss, nosebleeds and trouble swallowing.   Eyes: Negative for pain and visual disturbance.  Respiratory: Negative for cough, chest tightness and shortness of breath.   Cardiovascular: Positive for chest pain. Negative for palpitations and leg swelling.  Gastrointestinal: Positive for anal bleeding. Negative for abdominal pain, blood in stool, constipation, diarrhea, nausea and vomiting.  Endocrine: Negative for polydipsia, polyphagia and polyuria.  Genitourinary: Negative for dysuria and flank pain.  Musculoskeletal: Positive for arthralgias. Negative for back pain, joint swelling, myalgias and neck stiffness.  Skin: Negative for color change, rash and wound.  Allergic/Immunologic: Positive for environmental allergies.  Neurological: Negative for dizziness, tremors, seizures, speech difficulty, weakness, light-headedness and headaches.  Psychiatric/Behavioral: Negative for behavioral problems, confusion, decreased concentration, dysphoric mood and sleep disturbance. The patient is not nervous/anxious.   All other systems reviewed and are negative.   Social History      He  reports that he has never smoked. He has never used smokeless tobacco. He reports that he drinks alcohol. He reports that he does not use drugs.       Social History   Socioeconomic History  . Marital status: Married    Spouse name: Not on file  . Number of children: Not on file  . Years of education: Not on file  . Highest education level: Not on file  Occupational History  .  Not on file  Social Needs  . Financial resource strain: Not on file  . Food insecurity:    Worry: Not on file    Inability: Not on file  . Transportation needs:    Medical: Not on file    Non-medical: Not on file  Tobacco Use  .  Smoking status: Never Smoker  . Smokeless tobacco: Never Used  Substance and Sexual Activity  . Alcohol use: Yes    Alcohol/week: 0.0 oz  . Drug use: No  . Sexual activity: Not on file  Lifestyle  . Physical activity:    Days per week: Not on file    Minutes per session: Not on file  . Stress: Not on file  Relationships  . Social connections:    Talks on phone: Not on file    Gets together: Not on file    Attends religious service: Not on file    Active member of club or organization: Not on file    Attends meetings of clubs or organizations: Not on file    Relationship status: Not on file  Other Topics Concern  . Not on file  Social History Narrative  . Not on file    Past Medical History:  Diagnosis Date  . Asthma   . Gout      Patient Active Problem List   Diagnosis Date Noted  . Hyperuricemia 02/14/2016  . Benign neoplasm of transverse colon   . Benign neoplasm of sigmoid colon   . Allergic rhinitis 09/06/2015  . 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 Family history of malignant neoplasm of prostate 09/06/2015  . 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 Hyperlipidemia 09/06/2015  . Hemorrhoids, internal 09/06/2015  . Adiposity 09/06/2015    Past Surgical History:  Procedure Laterality Date  . COLONOSCOPY WITH PROPOFOL N/A 11/11/2015   Procedure: COLONOSCOPY WITH PROPOFOL;  Surgeon: Lucilla Lame, MD;  Location: [facility];  Service: Endoscopy;  Laterality: N/A;  . KNEE ARTHROSCOPY Right   . POLYPECTOMY N/A 11/11/2015   Procedure: POLYPECTOMY;  Surgeon: Lucilla Lame, MD;  Location: [facility];  Service: Endoscopy;  Laterality: N/A;    Family History        Family Status  Relation Name Status  . Father  Deceased  . Mother  Deceased  . Brother  Alive  . Brother  Alive        His family history includes Deep vein thrombosis in his brother and brother; Other in his father; Parkinsonism in his mother; Prostate cancer in his father.      No Known  Allergies   Current Outpatient Medications:  .  allopurinol (ZYLOPRIM) 100 MG tablet, One tablet 100 mg along with one tablet of 300 mg (total 400 mg ) daily, Disp: , Rfl:  .  allopurinol (ZYLOPRIM) 300 MG tablet, Take 1 tablet by mouth daily., Disp: , Rfl:  .  aspirin 81 MG tablet, Take 81 mg by mouth daily., Disp: , Rfl:  .  b complex vitamins tablet, Take 1 tablet by mouth daily., Disp: , Rfl:  .  colchicine 0.6 MG tablet, Take 2 tabs PO x 1, then 1 tab PO 1 hour later x 1  Max: 1.8 mg total dose per attack, do not repeat within 3 days. (Patient not taking: Reported on 01/15/2017), Disp: 10 tablet, Rfl: 0 .  etodolac (LODINE) 500 MG tablet, Take 500 mg by mouth 2 (two) times daily., Disp: , Rfl:  .  indomethacin (INDOCIN) 50 MG capsule, Take 1  capsule (50 mg total) by mouth 3 (three) times daily as needed. (Patient not taking: Reported on 10/02/2017), Disp: 30 capsule, Rfl: 0 .  magnesium 30 MG tablet, Take 30 mg by mouth 2 (two) times daily., Disp: , Rfl:  .  TURMERIC PO, Take 1 capsule by mouth daily. , Disp: , Rfl:    Patient Care Team: Birdie Sons, MD as PCP - General (Family Medicine)      Objective:   Vitals: BP 132/72 (BP Location: Left Arm, Patient Position: Sitting, Cuff Size: Large)   Pulse 75   Temp 98.1 F (36.7 C) (Oral)   Resp 16   Ht 6' (1.829 m)   Wt 242 lb (109.8 kg)   SpO2 97% Comment: room air  BMI 32.82 kg/m    Vitals:   10/02/17 0900  BP: 132/72  Pulse: 75  Resp: 16  Temp: 98.1 F (36.7 C)  TempSrc: Oral  SpO2: 97%  Weight: 242 lb (109.8 kg)  Height: 6' (1.829 m)     Physical Exam    General Appearance:    Alert, cooperative, no distress, appears stated age, obese  Head:    Normocephalic, without obvious abnormality, atraumatic  Eyes:    PERRL, conjunctiva/corneas clear, EOM's intact, fundi    benign, both eyes       Ears:    Normal TM's and external ear canals, both ears  Nose:   Nares normal, septum midline, mucosa normal, no  drainage   or sinus tenderness  Throat:   Lips, mucosa, and tongue normal; teeth and gums normal  Neck:   Supple, symmetrical, trachea midline, no adenopathy;       thyroid:  No enlargement/tenderness/nodules; no carotid   bruit or JVD  Back:     Symmetric, no curvature, ROM normal, no CVA tenderness  Lungs:     Clear to auscultation bilaterally, respirations unlabored  Chest wall:    No tenderness or deformity  Heart:    Regular rate and rhythm, S1 and S2 normal, no murmur, rub   or gallop  Abdomen:     Soft, non-tender, bowel sounds active all four quadrants,    no masses, no organomegaly  Genitalia:    deferred  Rectal:    deferred  Extremities:   Extremities normal, atraumatic, no cyanosis or edema  Pulses:   2+ and symmetric all extremities  Skin:   Skin color, texture, turgor normal, no rashes or lesions  Lymph nodes:   Cervical, supraclavicular, and axillary nodes normal  Neurologic:   CNII-XII intact. Normal strength, sensation and reflexes      throughout     Depression Screen PHQ 2/9 Scores 10/02/2017 10/08/2015  PHQ - 2 Score 0 0  PHQ- 9 Score 0 0     Assessment & Plan:     Routine Health Maintenance and Physical Exam  Exercise Activities and Dietary recommendations Goals    None      Immunization History  Administered Date(s) Administered  . Influenza,inj,Quad PF,6+ Mos 01/15/2017  . Tdap 09/04/2013    Health Maintenance  Topic Date Due  . HIV Screening  11/04/1978  . INFLUENZA VACCINE  10/18/2017  . COLONOSCOPY  11/10/2020  . TETANUS/TDAP  09/05/2023  . Hepatitis C Screening  Completed     Discussed health benefits of physical activity, and encouraged him to engage in regular exercise appropriate for his age and condition.    --------------------------------------------------------------------  1. Annual physical exam  - Lipid panel - PSA - EKG  12-Lead  2. Prostate cancer screening  - PSA  3. Hyperlipidemia, unspecified hyperlipidemia  type  - Lipid panel  4. Hyperglycemia  - Hemoglobin A1c  5. Bilateral thoracic back pain, unspecified chronicity  - DG Thoracic Spine W/Swimmers; Future    Lelon Huh, MD  [facility]

## 2017-10-02 NOTE — Patient Instructions (Signed)
   Please contact your eyecare professional to schedule a routine eye exam   It is recommended to engage in 150 minutes of moderate exercise every week.

## 2017-10-03 ENCOUNTER — Telehealth: Payer: Self-pay | Admitting: Family Medicine

## 2017-10-03 ENCOUNTER — Telehealth: Payer: Self-pay

## 2017-10-03 LAB — PSA: PROSTATE SPECIFIC AG, SERUM: 0.9 ng/mL (ref 0.0–4.0)

## 2017-10-03 LAB — LIPID PANEL
Chol/HDL Ratio: 7.1 ratio — ABNORMAL HIGH (ref 0.0–5.0)
Cholesterol, Total: 275 mg/dL — ABNORMAL HIGH (ref 100–199)
HDL: 39 mg/dL — ABNORMAL LOW (ref >39)
LDL CALC: 183 mg/dL — AB (ref 0–99)
Triglycerides: 264 mg/dL — ABNORMAL HIGH (ref 0–149)
VLDL CHOLESTEROL CAL: 53 mg/dL — AB (ref 5–40)

## 2017-10-03 LAB — HEMOGLOBIN A1C
ESTIMATED AVERAGE GLUCOSE: 111 mg/dL
HEMOGLOBIN A1C: 5.5 % (ref 4.8–5.6)

## 2017-10-03 MED ORDER — ROSUVASTATIN CALCIUM 20 MG PO TABS: 20.0000 mg | ORAL_TABLET | Freq: Every day | ORAL | 2 refills | Status: DC

## 2017-10-03 NOTE — Telephone Encounter (Signed)
Patient wants lab results from 10/02/17 please.

## 2017-10-03 NOTE — Telephone Encounter (Signed)
Pt advised.  RX sent to [facility], apt made for 12/12/2017  Thanks,   -Mickel Baas

## 2017-10-03 NOTE — Telephone Encounter (Signed)
-----   Message from Birdie Sons, MD sent at 10/03/2017  7:52 AM EDT ----- Cholesterol is very high at 275. This puts him at increased risk for heart disease. Need to start rosuvastatin 20mg  once a day, #30, rf x 2 and schedule follow up 8 weeks.  Need to go to [place] [facility] for xray of back due to back pain he was reporting.  order has already been entered.

## 2017-10-04 NOTE — Telephone Encounter (Signed)
Patient has been advised of results by Rhys Martini in a separate telephone message.

## 2017-10-05 ENCOUNTER — Ambulatory Visit
Admission: RE | Admit: 2017-10-05 | Discharge: 2017-10-05 | Disposition: A | Discharge status: Home / Self Care | Payer: BLUE CROSS/BLUE SHIELD | Source: Ambulatory Visit | Attending: Family Medicine | Admitting: Family Medicine

## 2017-10-05 DIAGNOSIS — M4854XA Collapsed vertebra, not elsewhere classified, thoracic region, initial encounter for fracture: Secondary | ICD-10-CM | POA: Insufficient documentation

## 2017-10-05 DIAGNOSIS — M546 Pain in thoracic spine: Secondary | ICD-10-CM | POA: Diagnosis not present

## 2017-10-05 DIAGNOSIS — M47814 Spondylosis without myelopathy or radiculopathy, thoracic region: Secondary | ICD-10-CM | POA: Diagnosis not present

## 2017-10-05 DIAGNOSIS — M4184 Other forms of scoliosis, thoracic region: Secondary | ICD-10-CM | POA: Diagnosis not present

## 2017-10-08 ENCOUNTER — Telehealth: Payer: Self-pay | Admitting: *Deleted

## 2017-10-08 DIAGNOSIS — M546 Pain in thoracic spine: Secondary | ICD-10-CM

## 2017-10-08 NOTE — Telephone Encounter (Signed)
-----   Message from Birdie Sons, MD sent at 10/08/2017  1:47 PM EDT ----- [place] Brunette show a few compression fractures in vertebra. Need to order bone density test for further evaluation. This could be contributing to his pains.

## 2017-10-08 NOTE — Telephone Encounter (Signed)
Patient was notified of results. Expressed understanding. Bone density ordered.

## 2017-10-31 ENCOUNTER — Ambulatory Visit
Admission: RE | Admit: 2017-10-31 | Discharge: 2017-10-31 | Disposition: A | Discharge status: Home / Self Care | Payer: BLUE CROSS/BLUE SHIELD | Source: Ambulatory Visit | Attending: Family Medicine | Admitting: Family Medicine

## 2017-10-31 DIAGNOSIS — M546 Pain in thoracic spine: Secondary | ICD-10-CM | POA: Insufficient documentation

## 2017-10-31 DIAGNOSIS — Z13820 Encounter for screening for osteoporosis: Secondary | ICD-10-CM | POA: Diagnosis not present

## 2017-11-01 ENCOUNTER — Telehealth: Payer: Self-pay | Admitting: Family Medicine

## 2017-11-01 MED ORDER — PREDNISONE 10 MG PO TABS: ORAL_TABLET | ORAL | 0 refills | Status: DC

## 2017-11-01 NOTE — Telephone Encounter (Signed)
Pt had a "Bone Scan" yesterday and is calling for the results  Pt's  CB#  [phone]  Thanks teri

## 2017-11-02 NOTE — Telephone Encounter (Signed)
Patient was notified of results. Patient wanted to know if the compression fractures that were noted previously, were seen on bone density? Also what further concerns should he have concerning fractures?

## 2017-11-02 NOTE — Telephone Encounter (Signed)
Patient advised as below and verbally voiced understanding.  

## 2017-11-02 NOTE — Telephone Encounter (Signed)
The bone density test only looks at lumbar spine, it doesn't look at thoracic spine where the compression fracture was.  Best thing to preserve bone density is to exercise regularly including some weight resistant exercise (weight lifting) avoid soft drinks which deplete calcium from bones. May want to take a daily 1,000 unit vitamin D3 as well.  We can also recheck bone density test in about 5 years to make sure it is stable.

## 2017-11-07 ENCOUNTER — Telehealth: Payer: Self-pay | Admitting: Family Medicine

## 2017-11-07 NOTE — Telephone Encounter (Signed)
Please advise 

## 2017-11-07 NOTE — Telephone Encounter (Signed)
Done. Called pharmacy and cancelled prednisone prescription that was sent on 11/01/2017. I called patients cell phone and left a detailed message to discard message form pharmacy about prescription for prednisone.

## 2017-11-07 NOTE — Telephone Encounter (Signed)
I think it was an error. Please call [facility] and cancel prednisone prescription.

## 2017-11-07 NOTE — Telephone Encounter (Signed)
Patient called stating he didn't know why he was given Prednisone and feels like this is an error. [facility] pharmacy just called him stating his RX was ready but he doesn't know why Dr. Wanda Plump is giving him Prednisone.   Please clarify.

## 2017-12-12 ENCOUNTER — Encounter: Payer: Self-pay | Admitting: Family Medicine

## 2017-12-12 ENCOUNTER — Ambulatory Visit: Payer: BLUE CROSS/BLUE SHIELD | Admitting: Family Medicine

## 2017-12-12 VITALS — BP 100/69 | HR 68 | Temp 98.2°F | Resp 16 | Wt 245.0 lb

## 2017-12-12 DIAGNOSIS — Z23 Encounter for immunization: Secondary | ICD-10-CM

## 2017-12-12 DIAGNOSIS — E785 Hyperlipidemia, unspecified: Secondary | ICD-10-CM

## 2017-12-12 NOTE — Progress Notes (Signed)
Patient: Gilbert Lee Male    DOB: 1965/09/10   52 y.o.   MRN: [id number] Visit Date: 12/12/2017  Today's Provider: Lelon Huh, MD   Chief Complaint  Patient presents with  . Follow-up  . Hyperlipidemia   Subjective:    HPI    Lipid/Cholesterol, Follow-up:   Last seen for this 2 months ago.  Management since that visit includes; labs checked. Started rosuvastatin 20 mg qd. Advised to follow up in 8 weeks.  Last Lipid Panel:    Component Value Date/Time   CHOL 275 (H) 10/02/2017 0943   TRIG 264 (H) 10/02/2017 0943   HDL 39 (L) 10/02/2017 0943   CHOLHDL 7.1 (H) 10/02/2017 0943   CHOLHDL 6.3 (H) 01/16/2017 0813   LDLCALC 183 (H) 10/02/2017 0943   LDLCALC 162 (H) 01/16/2017 0813    He reports good compliance with treatment. He is not having side effects. None  Has not made any changes in diet as he wanted to see how much effect rosuvastatin had. He rarely eats read meat due to gout, but does eat chicken and fried foods.   Wt Readings from Last 3 Encounters:  12/12/17 245 lb (111.1 kg)  10/02/17 242 lb (109.8 kg)  01/15/17 251 lb (113.9 kg)    ---------------------------------------------------------------    No Known Allergies   Current Outpatient Medications:  .  allopurinol (ZYLOPRIM) 100 MG tablet, One tablet 100 mg along with one tablet of 300 mg (total 400 mg ) daily, Disp: , Rfl:  .  allopurinol (ZYLOPRIM) 300 MG tablet, Take 1 tablet by mouth daily., Disp: , Rfl:  .  rosuvastatin (CRESTOR) 20 MG tablet, Take 1 tablet (20 mg total) by mouth daily., Disp: 30 tablet, Rfl: 2 .  aspirin 81 MG tablet, Take 81 mg by mouth daily., Disp: , Rfl:  .  b complex vitamins tablet, Take 1 tablet by mouth daily., Disp: , Rfl:  .  colchicine 0.6 MG tablet, Take 2 tabs PO x 1, then 1 tab PO 1 hour later x 1  Max: 1.8 mg total dose per attack, do not repeat within 3 days. (Patient not taking: Reported on 12/12/2017), Disp: 10 tablet, Rfl: 0 .  etodolac (LODINE)  500 MG tablet, Take 500 mg by mouth 2 (two) times daily., Disp: , Rfl:  .  indomethacin (INDOCIN) 50 MG capsule, Take 1 capsule (50 mg total) by mouth 3 (three) times daily as needed. (Patient not taking: Reported on 12/12/2017), Disp: 30 capsule, Rfl: 0 .  magnesium 30 MG tablet, Take 30 mg by mouth 2 (two) times daily., Disp: , Rfl:  .  predniSONE (DELTASONE) 10 MG tablet, Take 6 tablets for 2 days, 5 tablets for 2 days, 4 tablets for 2 days, 3 tablets for 2 days, 2 tablets for 2 days, 1 tablet for 2 daysa (Patient not taking: Reported on 12/12/2017), Disp: 42 tablet, Rfl: 0 .  TURMERIC PO, Take 1 capsule by mouth daily. , Disp: , Rfl:   Review of Systems  Constitutional: Negative for appetite change, chills and fever.  Respiratory: Negative for chest tightness, shortness of breath and wheezing.   Cardiovascular: Negative for chest pain and palpitations.  Gastrointestinal: Negative for abdominal pain, nausea and vomiting.    Social History   Tobacco Use  . Smoking status: Never Smoker  . Smokeless tobacco: Never Used  Substance Use Topics  . Alcohol use: Yes    Alcohol/week: 0.0 standard drinks   Objective:  BP 100/69 (BP Location: Right Arm, Patient Position: Sitting, Cuff Size: Large)   Pulse 68   Temp 98.2 F (36.8 C) (Oral)   Resp 16   Wt 245 lb (111.1 kg)   SpO2 97%   BMI 33.23 kg/m  Vitals:   12/12/17 0813  BP: 100/69  Pulse: 68  Resp: 16  Temp: 98.2 F (36.8 C)  TempSrc: Oral  SpO2: 97%  Weight: 245 lb (111.1 kg)     Physical Exam   General appearance: alert, well developed, well nourished, cooperative and in no distress Head: Normocephalic, without obvious abnormality, atraumatic Respiratory: Respirations even and unlabored, normal respiratory rate Extremities: No gross deformities Skin: Skin color, texture, turgor normal. No rashes seen  Psych: Appropriate mood and affect. Neurologic: Mental status: Alert, oriented to person, place, and time, thought  content appropriate.     Assessment & Plan:     1. Hyperlipidemia, unspecified hyperlipidemia type Tolerating rosuvastatin well.  - Lipid panel - Comprehensive metabolic panel  Flu vaccine given today.       Lelon Huh, MD  [facility]

## 2017-12-13 LAB — COMPREHENSIVE METABOLIC PANEL
A/G RATIO: 2 (ref 1.2–2.2)
ALBUMIN: 4.3 g/dL (ref 3.5–5.5)
ALT: 37 IU/L (ref 0–44)
AST: 22 IU/L (ref 0–40)
Alkaline Phosphatase: 47 IU/L (ref 39–117)
BUN / CREAT RATIO: 17 (ref 9–20)
BUN: 18 mg/dL (ref 6–24)
Bilirubin Total: 0.4 mg/dL (ref 0.0–1.2)
CALCIUM: 9.8 mg/dL (ref 8.7–10.2)
CO2: 22 mmol/L (ref 20–29)
CREATININE: 1.08 mg/dL (ref 0.76–1.27)
Chloride: 108 mmol/L — ABNORMAL HIGH (ref 96–106)
GFR, EST AFRICAN AMERICAN: 89 mL/min/{1.73_m2} (ref >59)
GFR, EST NON AFRICAN AMERICAN: 77 mL/min/{1.73_m2} (ref >59)
GLOBULIN, TOTAL: 2.2 g/dL (ref 1.5–4.5)
Glucose: 100 mg/dL — ABNORMAL HIGH (ref 65–99)
POTASSIUM: 4.5 mmol/L (ref 3.5–5.2)
SODIUM: 145 mmol/L — AB (ref 134–144)
Total Protein: 6.5 g/dL (ref 6.0–8.5)

## 2017-12-13 LAB — LIPID PANEL
CHOLESTEROL TOTAL: 169 mg/dL (ref 100–199)
Chol/HDL Ratio: 4 ratio (ref 0.0–5.0)
HDL: 42 mg/dL (ref >39)
LDL Calculated: 94 mg/dL (ref 0–99)
TRIGLYCERIDES: 167 mg/dL — AB (ref 0–149)
VLDL Cholesterol Cal: 33 mg/dL (ref 5–40)

## 2017-12-17 ENCOUNTER — Telehealth: Payer: Self-pay | Admitting: Family Medicine

## 2017-12-17 MED ORDER — HYDROCORTISONE ACETATE 25 MG RE SUPP: 25.0000 mg | Freq: Two times a day (BID) | RECTAL | 3 refills | Status: DC

## 2017-12-17 NOTE — Telephone Encounter (Signed)
Pt asking if Rx prescribed that has run out of date can be recalled in for him.  Pt is having hemorrhoid issue. Anucort - HC  Please call into: [id number] [address], [place] [phone] (Phone) [phone] (Fax)     Thanks, [place]

## 2017-12-21 ENCOUNTER — Telehealth: Payer: Self-pay | Admitting: Family Medicine

## 2017-12-21 ENCOUNTER — Other Ambulatory Visit: Payer: Self-pay | Admitting: Family Medicine

## 2017-12-21 MED ORDER — HYDROCORTISONE ACE-PRAMOXINE 1-1 % RE CREA: 1.0000 "application " | TOPICAL_CREAM | Freq: Two times a day (BID) | RECTAL | 1 refills | Status: DC

## 2017-12-21 NOTE — Telephone Encounter (Signed)
Pt's insurance will not cover the hydrocortisone (ANUCORT-HC) 25 MG suppository. Pt is asking if another medication can be called in today because he is going out of town this weekend. Please call pt back to let him know if it can be done and called into:  [facility] [place], [place] [phone] (Phone) [phone]    Thanks, [employer]

## 2017-12-21 NOTE — Addendum Note (Signed)
Addended by: Birdie Sons on: 12/21/2017 05:56 PM   Modules accepted: Orders

## 2017-12-21 NOTE — Telephone Encounter (Signed)
Please review

## 2017-12-24 NOTE — Telephone Encounter (Signed)
Changed to analpram

## 2018-01-01 ENCOUNTER — Other Ambulatory Visit: Payer: Self-pay | Admitting: Family Medicine

## 2018-02-14 ENCOUNTER — Other Ambulatory Visit: Payer: Self-pay | Admitting: Family Medicine

## 2018-04-10 DIAGNOSIS — M542 Cervicalgia: Secondary | ICD-10-CM | POA: Diagnosis not present

## 2018-04-10 DIAGNOSIS — M5416 Radiculopathy, lumbar region: Secondary | ICD-10-CM | POA: Diagnosis not present

## 2018-04-10 DIAGNOSIS — M9901 Segmental and somatic dysfunction of cervical region: Secondary | ICD-10-CM | POA: Diagnosis not present

## 2018-04-10 DIAGNOSIS — M9903 Segmental and somatic dysfunction of lumbar region: Secondary | ICD-10-CM | POA: Diagnosis not present

## 2018-04-12 DIAGNOSIS — J019 Acute sinusitis, unspecified: Secondary | ICD-10-CM | POA: Diagnosis not present

## 2018-04-12 DIAGNOSIS — B9689 Other specified bacterial agents as the cause of diseases classified elsewhere: Secondary | ICD-10-CM | POA: Diagnosis not present

## 2018-04-12 DIAGNOSIS — J209 Acute bronchitis, unspecified: Secondary | ICD-10-CM | POA: Diagnosis not present

## 2018-06-10 ENCOUNTER — Telehealth: Payer: Self-pay

## 2018-06-10 NOTE — Telephone Encounter (Signed)
Patient calling office requesting refill for Albuterol inhaler sent on [facility] on [address] [place]

## 2018-06-10 NOTE — Telephone Encounter (Signed)
Pt states he has a history of asthma.  He has not needed his albuterol inhaler in a long time and it is expired.  With Covid-19 he wants to have one that is in date.    Thanks,   -Mickel Baas

## 2018-06-11 MED ORDER — ALBUTEROL SULFATE HFA 108 (90 BASE) MCG/ACT IN AERS: 1.0000 | INHALATION_SPRAY | Freq: Four times a day (QID) | RESPIRATORY_TRACT | 2 refills | Status: DC | PRN

## 2018-06-11 NOTE — Telephone Encounter (Signed)
Patient requesting Ventolin to be sent in today. [facility] [address]

## 2018-06-13 IMAGING — US US EXTREM LOW VENOUS*R*
1 series · 13 of 24 positions shown · non-contrast
Comparison: None.

CLINICAL DATA: Acute right knee pain



[Series 1: us extrem low venous*right* · 0.09mm/px · 13 of 31 slices shown]
[im 1/31]
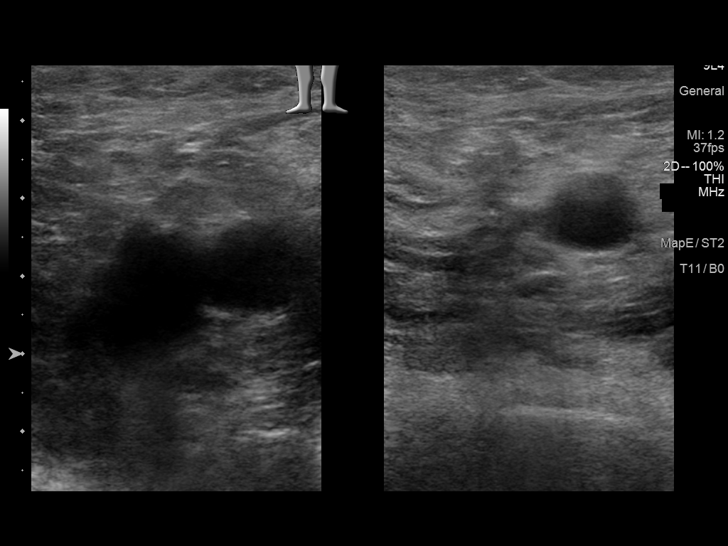
[im 3/31]
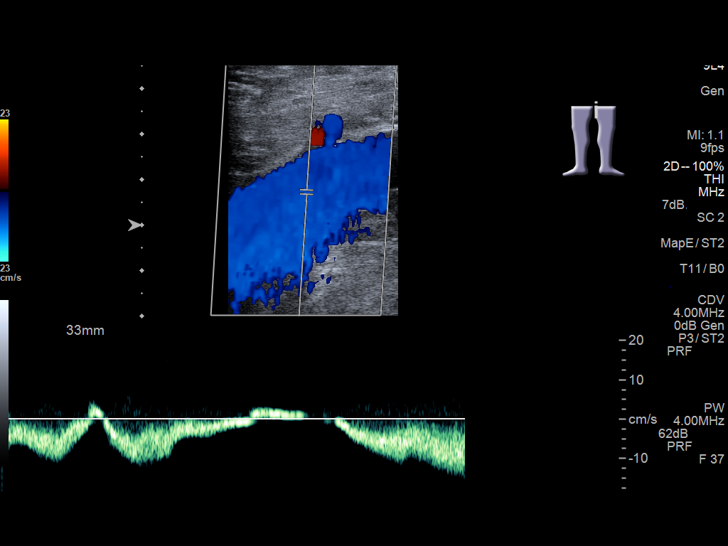
[im 6/31]
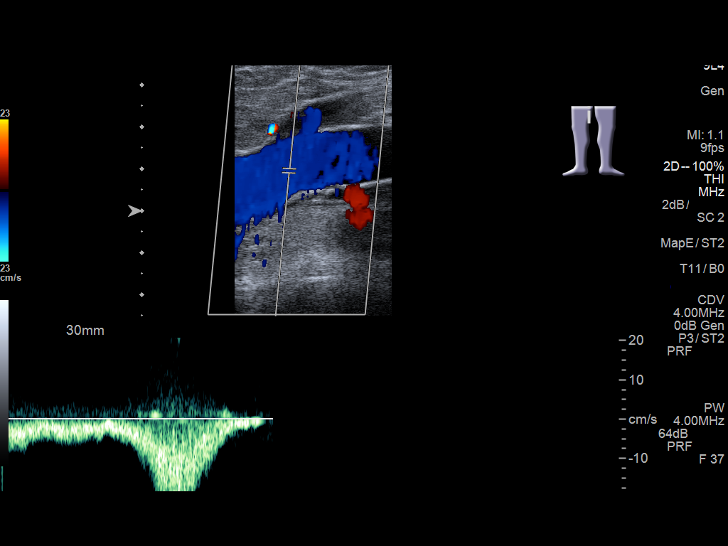
[im 8/31]
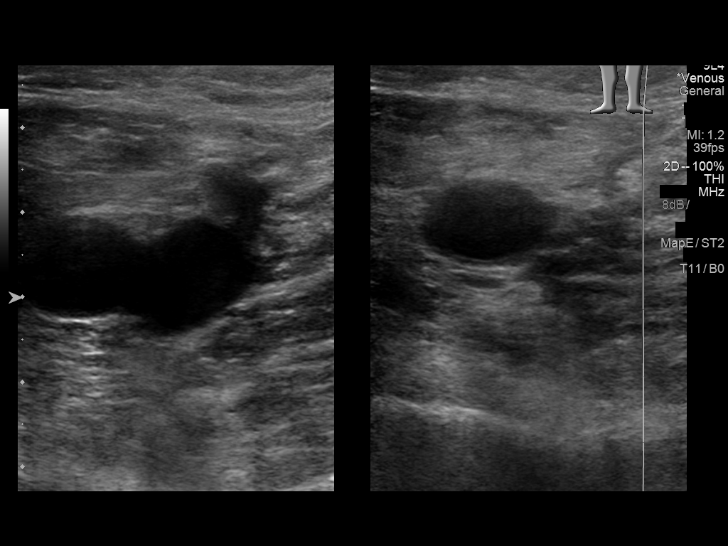
[im 11/31]
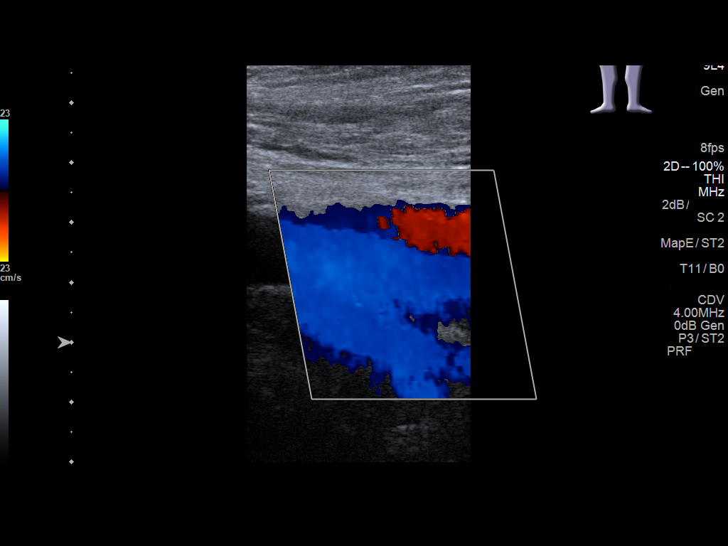
[im 14/31]
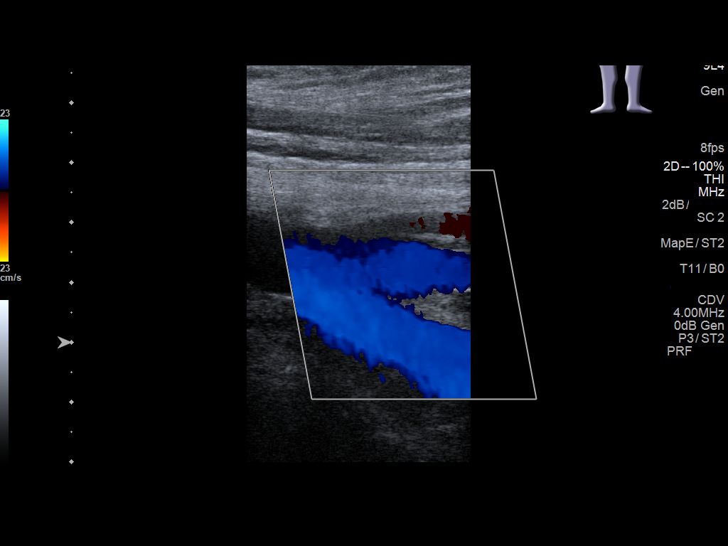
[im 16/31]
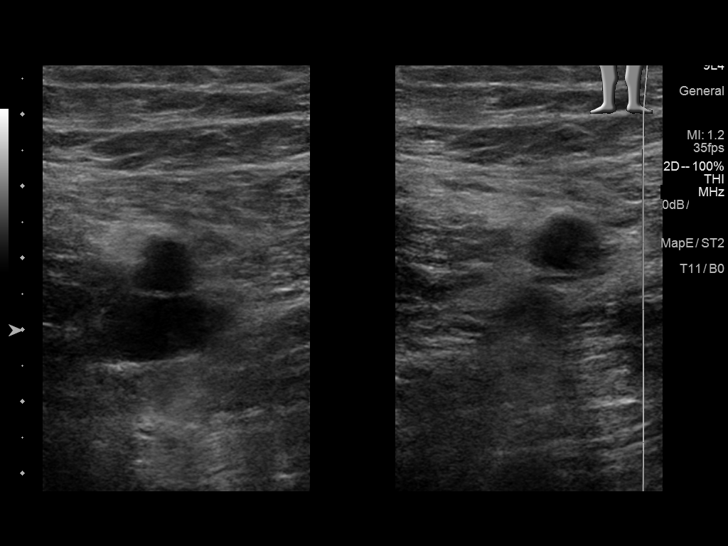
[im 17/31]
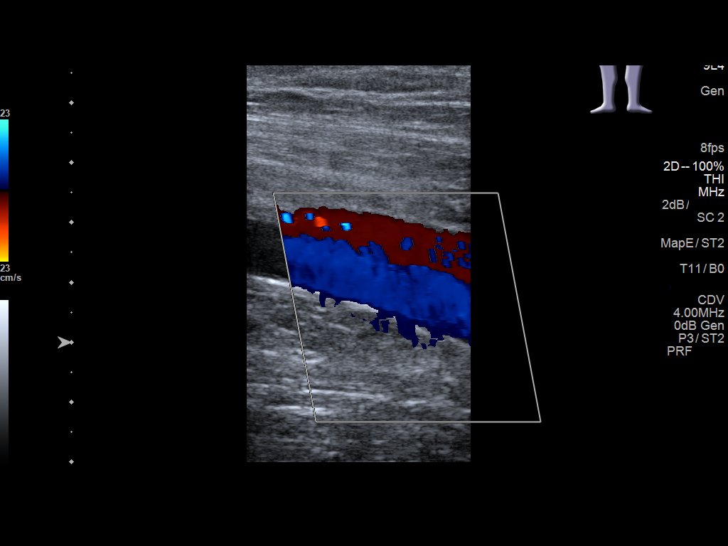
[im 20/31]
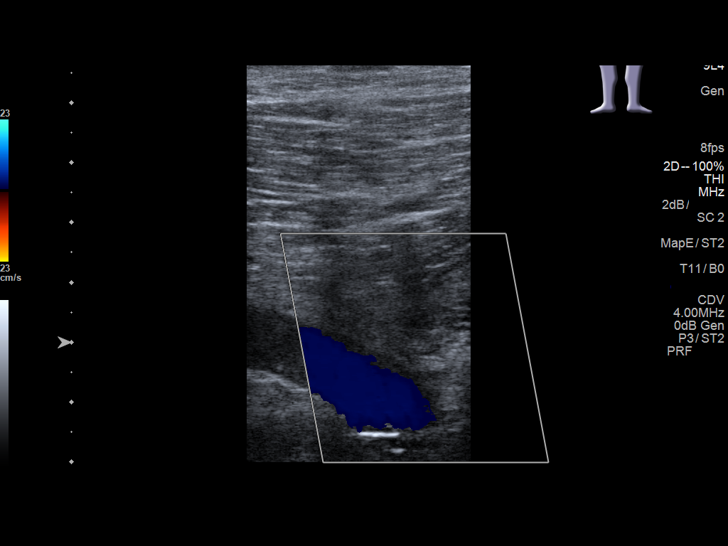
[im 23/31]
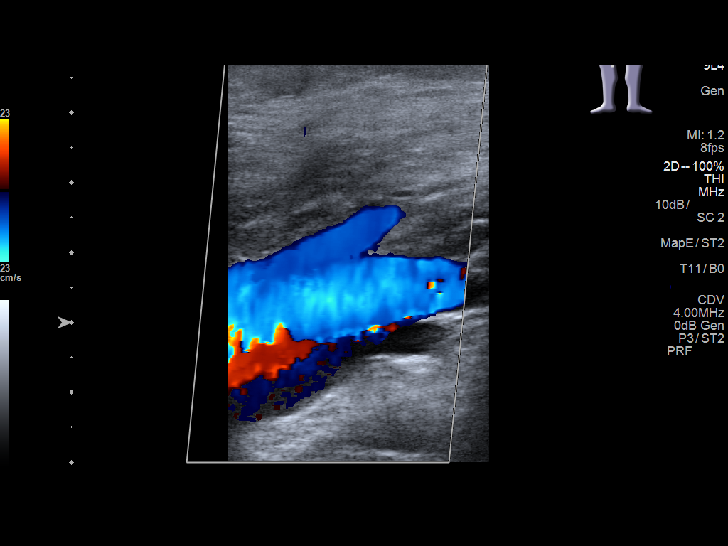
[im 25/31]
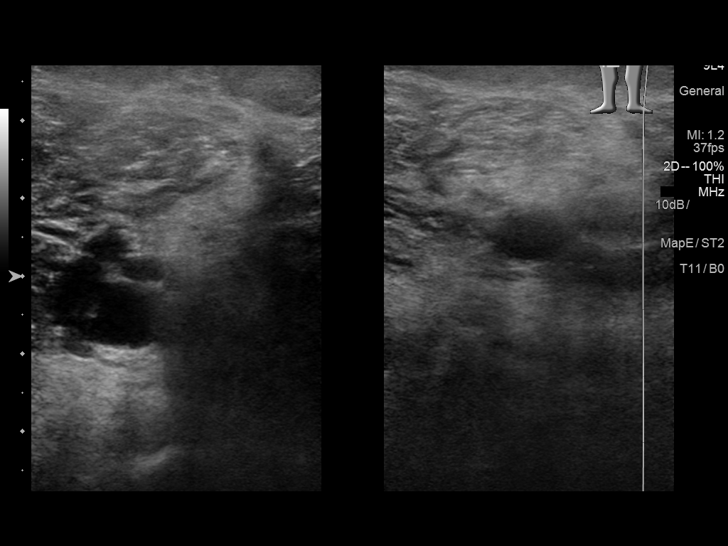
[im 28/31]
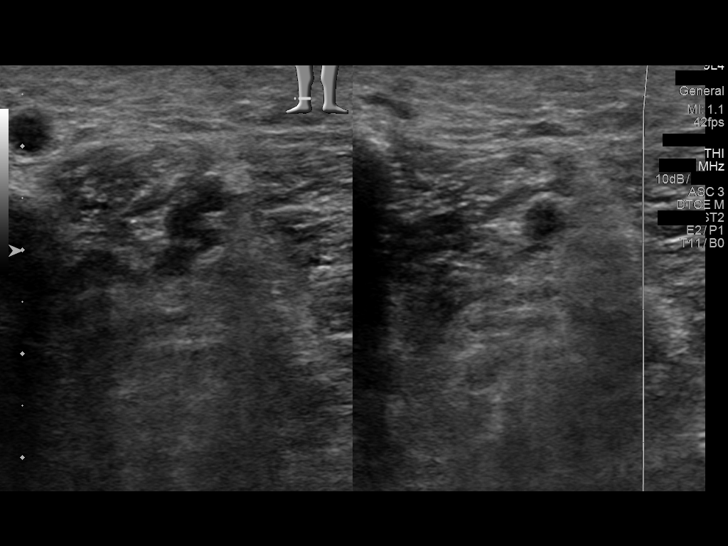
[im 31/31]
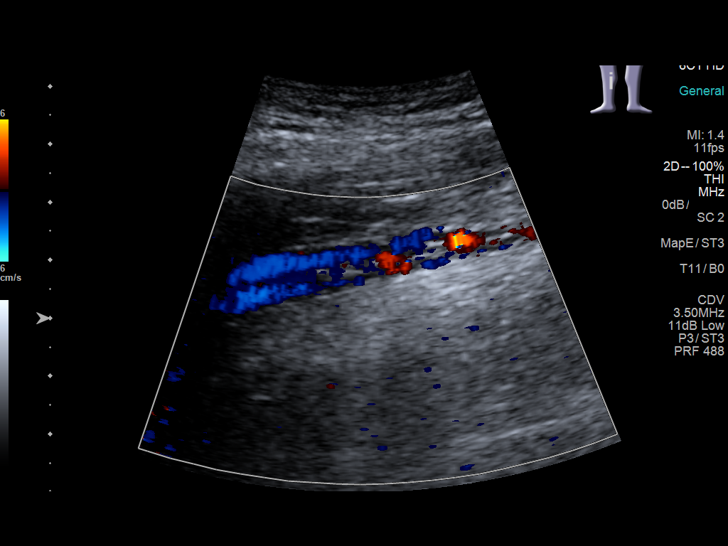

[13 of 24 positions shown; findings below may reference images not displayed]

FINDINGS: Contralateral Common Femoral Vein: Respiratory phasicity is normal
and symmetric with the symptomatic side. No evidence of thrombus.
Normal compressibility.

Common Femoral Vein: No evidence of thrombus. Normal
compressibility, respiratory phasicity and response to augmentation.

Saphenofemoral Junction: No evidence of thrombus. Normal
compressibility and flow on color Doppler imaging.

Profunda Femoral Vein: No evidence of thrombus. Normal
compressibility and flow on color Doppler imaging.

Femoral Vein: No evidence of thrombus. Normal compressibility,
respiratory phasicity and response to augmentation.

Popliteal Vein: No evidence of thrombus. Normal compressibility,
respiratory phasicity and response to augmentation.

Calf Veins: Posterior tibial veins appear patent. Peroneal veins not
well demonstrated.

Superficial Great Saphenous Vein: No evidence of thrombus. Normal
compressibility and flow on color Doppler imaging.

Venous Reflux:  None.

Other Findings:  None.
IMPRESSION: No significant right lower extremity DVT. Limited assessment of the
calf veins.

## 2018-09-09 DIAGNOSIS — E669 Obesity, unspecified: Secondary | ICD-10-CM | POA: Diagnosis not present

## 2018-09-09 DIAGNOSIS — M1A00X0 Idiopathic chronic gout, unspecified site, without tophus (tophi): Secondary | ICD-10-CM | POA: Diagnosis not present

## 2018-09-09 DIAGNOSIS — Z79899 Other long term (current) drug therapy: Secondary | ICD-10-CM | POA: Diagnosis not present

## 2018-11-13 ENCOUNTER — Other Ambulatory Visit: Payer: Self-pay

## 2018-11-13 ENCOUNTER — Encounter: Payer: Self-pay | Admitting: Anesthesiology

## 2018-11-13 ENCOUNTER — Emergency Department
Admission: EM | Admit: 2018-11-13 | Discharge: 2018-11-14 | Disposition: A | Discharge status: Home / Self Care | Payer: BC Managed Care – PPO | Attending: Student | Admitting: Student

## 2018-11-13 ENCOUNTER — Encounter
Admission: EM | Disposition: A | Discharge status: Home / Self Care | Payer: Self-pay | Source: Home / Self Care | Attending: Student

## 2018-11-13 DIAGNOSIS — Z79899 Other long term (current) drug therapy: Secondary | ICD-10-CM | POA: Diagnosis not present

## 2018-11-13 DIAGNOSIS — M1990 Unspecified osteoarthritis, unspecified site: Secondary | ICD-10-CM | POA: Insufficient documentation

## 2018-11-13 DIAGNOSIS — M109 Gout, unspecified: Secondary | ICD-10-CM | POA: Insufficient documentation

## 2018-11-13 DIAGNOSIS — T18128A Food in esophagus causing other injury, initial encounter: Secondary | ICD-10-CM | POA: Insufficient documentation

## 2018-11-13 DIAGNOSIS — Z20828 Contact with and (suspected) exposure to other viral communicable diseases: Secondary | ICD-10-CM | POA: Diagnosis not present

## 2018-11-13 DIAGNOSIS — Z7982 Long term (current) use of aspirin: Secondary | ICD-10-CM | POA: Diagnosis not present

## 2018-11-13 DIAGNOSIS — Z6832 Body mass index (BMI) 32.0-32.9, adult: Secondary | ICD-10-CM | POA: Insufficient documentation

## 2018-11-13 DIAGNOSIS — E785 Hyperlipidemia, unspecified: Secondary | ICD-10-CM | POA: Insufficient documentation

## 2018-11-13 DIAGNOSIS — K209 Esophagitis, unspecified: Secondary | ICD-10-CM | POA: Insufficient documentation

## 2018-11-13 DIAGNOSIS — E669 Obesity, unspecified: Secondary | ICD-10-CM | POA: Insufficient documentation

## 2018-11-13 DIAGNOSIS — J45909 Unspecified asthma, uncomplicated: Secondary | ICD-10-CM | POA: Insufficient documentation

## 2018-11-13 DIAGNOSIS — X58XXXA Exposure to other specified factors, initial encounter: Secondary | ICD-10-CM | POA: Insufficient documentation

## 2018-11-13 DIAGNOSIS — Z03818 Encounter for observation for suspected exposure to other biological agents ruled out: Secondary | ICD-10-CM | POA: Diagnosis not present

## 2018-11-13 DIAGNOSIS — T18108A Unspecified foreign body in esophagus causing other injury, initial encounter: Secondary | ICD-10-CM

## 2018-11-13 LAB — HEPATIC FUNCTION PANEL
ALT: 29 U/L (ref 0–44)
AST: 22 U/L (ref 15–41)
Albumin: 3.7 g/dL (ref 3.5–5.0)
Alkaline Phosphatase: 34 U/L — ABNORMAL LOW (ref 38–126)
Bilirubin, Direct: <0.1 mg/dL (ref 0.0–0.2)
Total Bilirubin: 0.6 mg/dL (ref 0.3–1.2)
Total Protein: 6.4 g/dL — ABNORMAL LOW (ref 6.5–8.1)

## 2018-11-13 LAB — CBC WITH DIFFERENTIAL/PLATELET
Abs Immature Granulocytes: 0.04 10*3/uL (ref 0.00–0.07)
Basophils Absolute: 0 10*3/uL (ref 0.0–0.1)
Basophils Relative: 0 %
Eosinophils Absolute: 0.1 10*3/uL (ref 0.0–0.5)
Eosinophils Relative: 1 %
HCT: 41 % (ref 39.0–52.0)
Hemoglobin: 13.7 g/dL (ref 13.0–17.0)
Immature Granulocytes: 0 %
Lymphocytes Relative: 21 %
Lymphs Abs: 2.3 10*3/uL (ref 0.7–4.0)
MCH: 30.3 pg (ref 26.0–34.0)
MCHC: 33.4 g/dL (ref 30.0–36.0)
MCV: 90.7 fL (ref 80.0–100.0)
Monocytes Absolute: 0.8 10*3/uL (ref 0.1–1.0)
Monocytes Relative: 8 %
Neutro Abs: 7.7 10*3/uL (ref 1.7–7.7)
Neutrophils Relative %: 70 %
Platelets: 175 10*3/uL (ref 150–400)
RBC: 4.52 MIL/uL (ref 4.22–5.81)
RDW: 12.8 % (ref 11.5–15.5)
WBC: 11 10*3/uL — ABNORMAL HIGH (ref 4.0–10.5)
nRBC: 0 % (ref 0.0–0.2)

## 2018-11-13 LAB — BASIC METABOLIC PANEL
Anion gap: 6 (ref 5–15)
BUN: 22 mg/dL — ABNORMAL HIGH (ref 6–20)
CO2: 26 mmol/L (ref 22–32)
Calcium: 8.8 mg/dL — ABNORMAL LOW (ref 8.9–10.3)
Chloride: 110 mmol/L (ref 98–111)
Creatinine, Ser: 1.01 mg/dL (ref 0.61–1.24)
GFR calc Af Amer: >60 mL/min (ref >60)
GFR calc non Af Amer: >60 mL/min (ref >60)
Glucose, Bld: 117 mg/dL — ABNORMAL HIGH (ref 70–99)
Potassium: 3.8 mmol/L (ref 3.5–5.1)
Sodium: 142 mmol/L (ref 135–145)

## 2018-11-13 LAB — SARS CORONAVIRUS 2 BY RT PCR (HOSPITAL ORDER, PERFORMED IN ~~LOC~~ HOSPITAL LAB): SARS Coronavirus 2: NEGATIVE

## 2018-11-13 LAB — LIPASE, BLOOD: Lipase: 87 U/L — ABNORMAL HIGH (ref 11–51)

## 2018-11-13 SURGERY — EGD (ESOPHAGOGASTRODUODENOSCOPY)
Anesthesia: Choice

## 2018-11-13 MED ORDER — GLUCAGON HCL RDNA (DIAGNOSTIC) 1 MG IJ SOLR
1.0000 mg | Freq: Once | INTRAMUSCULAR | Status: AC
  Administered 2018-11-13: 1 mg via INTRAVENOUS
  Filled 2018-11-13: qty 1

## 2018-11-13 MED ORDER — ONDANSETRON HCL 4 MG/2ML IJ SOLN
4.0000 mg | Freq: Once | INTRAMUSCULAR | Status: AC
  Administered 2018-11-14: 4 mg via INTRAVENOUS
  Filled 2018-11-13: qty 2

## 2018-11-13 MED ORDER — PANTOPRAZOLE SODIUM 40 MG IV SOLR
40.0000 mg | Freq: Once | INTRAVENOUS | Status: AC
  Administered 2018-11-13: 40 mg via INTRAVENOUS
  Filled 2018-11-13: qty 40

## 2018-11-13 MED ORDER — SODIUM CHLORIDE 0.9 % IV SOLN
Freq: Once | INTRAVENOUS | Status: AC
Start: 2018-11-13 — End: 2018-11-13
  Administered 2018-11-13: 21:00:00 via INTRAVENOUS

## 2018-11-13 MED ORDER — ONDANSETRON HCL 4 MG/2ML IJ SOLN
INTRAMUSCULAR | Status: AC
  Filled 2018-11-13: qty 2

## 2018-11-13 MED ORDER — PROPOFOL 10 MG/ML IV BOLUS
INTRAVENOUS | Status: AC
  Filled 2018-11-13: qty 20

## 2018-11-13 MED ORDER — FENTANYL CITRATE (PF) 100 MCG/2ML IJ SOLN
INTRAMUSCULAR | Status: AC
  Filled 2018-11-13: qty 2

## 2018-11-13 NOTE — ED Notes (Signed)
See triage note. Pt reports feeling like a piece of steak became stuck in his esophagus while eating. Pt states he tried to drink water and wash it down but threw up the water and some small pieces of steak. Still has sensation of something being stuck. NAD at this time. Pt able to manage secretions without difficulty.

## 2018-11-13 NOTE — ED Notes (Signed)
Provided pt with water to assess if he could drink and keep it down. Pt was able to swallow but stated that "it still didn't feel right" and the pt was still spitting up what he drank after drinking it. Notified Dr. Joan Mayans.

## 2018-11-13 NOTE — OR Nursing (Signed)
Per [place] Nurse, case is cancelled for the night and will go tomorrow at 730am.

## 2018-11-13 NOTE — ED Triage Notes (Signed)
Pt  Was eating steak and felt like became lodged. Pt not able to keep food or fluids down since.

## 2018-11-13 NOTE — ED Provider Notes (Signed)
[facility] Emergency Department Provider Note  ____________________________________________   First MD Initiated Contact with Patient 11/13/18 1958     (approximate)  I have reviewed the triage vital signs and the nursing notes.  History  Chief Complaint Foreign Body    HPI Gilbert Lee is a 53 y.o. male with a history of asthma, gout who presents for esophageal foreign body sensation.  Patient states he was eating steak for dinner and felt like it became lodged in his esophagus.  Localizes it to the mid anterior chest area.  After he developed the symptoms, he has been unable to keep down any fluids, even water. He is tolerating secretions, but he immediately regurgitates/vomits any water or other fluids.  He has vomited some small pieces of steak, but still feels the foreign body sensation is there.  He has no difficulty breathing. He has no history of similar symptoms.          Past Medical Hx Past Medical History:  Diagnosis Date  . Asthma   . Gout     Problem List Patient Active Problem List   Diagnosis Date Noted  . Hyperuricemia 02/14/2016  . Benign neoplasm of transverse colon   . Benign neoplasm of sigmoid colon   . Allergic rhinitis 09/06/2015  . 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 Family history of malignant neoplasm of prostate 09/06/2015  . 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 Hyperlipidemia 09/06/2015  . Hemorrhoids, internal 09/06/2015  . Adiposity 09/06/2015    Past Surgical Hx Past Surgical History:  Procedure Laterality Date  . COLONOSCOPY WITH PROPOFOL N/A 11/11/2015   Procedure: COLONOSCOPY WITH PROPOFOL;  Surgeon: Lucilla Lame, MD;  Location: [place];  Service: Endoscopy;  Laterality: N/A;  . KNEE ARTHROSCOPY Right   . POLYPECTOMY N/A 11/11/2015   Procedure: POLYPECTOMY;  Surgeon: Lucilla Lame, MD;  Location: [place];  Service: Endoscopy;  Laterality: N/A;    Medications Prior to Admission  medications   Medication Sig Start Date End Date Taking? Authorizing Provider  albuterol (PROVENTIL HFA;VENTOLIN HFA) 108 (90 Base) MCG/ACT inhaler Inhale 1-2 puffs into the lungs every 6 (six) hours as needed for shortness of breath. 06/11/18   Birdie Sons, MD  allopurinol (ZYLOPRIM) 100 MG tablet One tablet 100 mg along with one tablet of 300 mg (total 400 mg ) daily 03/29/17   [provider]  allopurinol (ZYLOPRIM) 300 MG tablet TAKE 1 TABLET(300 MG) BY MOUTH DAILY 02/14/18   Birdie Sons, MD  aspirin 81 MG tablet Take 81 mg by mouth daily.    [provider]  b complex vitamins tablet Take 1 tablet by mouth daily.    [provider]  colchicine 0.6 MG tablet Take 2 tabs PO x 1, then 1 tab PO 1 hour later x 1  Max: 1.8 mg total dose per attack, do not repeat within 3 days. Patient not taking: Reported on 12/12/2017 10/30/16   Menshew, Dannielle Karvonen, PA-C  etodolac (LODINE) 500 MG tablet Take 500 mg by mouth 2 (two) times daily.    [provider]  indomethacin (INDOCIN) 50 MG capsule Take 1 capsule (50 mg total) by mouth 3 (three) times daily as needed. Patient not taking: Reported on 12/12/2017 12/18/16   Loney Hering, MD  magnesium 30 MG tablet Take 30 mg by mouth 2 (two) times daily.    [provider]  pramoxine-hydrocortisone [facility]) 1-1 % rectal cream Place 1 application rectally 2 (two) times  daily. 12/21/17   Birdie Sons, MD  rosuvastatin (CRESTOR) 20 MG tablet TAKE 1 TABLET(20 MG) BY MOUTH DAILY 01/01/18   Birdie Sons, MD  TURMERIC PO Take 1 capsule by mouth daily.     [provider]    Allergies Patient has no known allergies.  Family Hx Family History  Problem Relation Age of Onset  . Other Father        intestinal obstruction  . Prostate cancer Father   . Parkinsonism Mother   . Deep vein thrombosis Brother   . Deep vein thrombosis Brother     Social Hx Social History   Tobacco Use  .  Smoking status: Never Smoker  . Smokeless tobacco: Never Used  Substance Use Topics  . Alcohol use: Yes    Alcohol/week: 0.0 standard drinks  . Drug use: No     Review of Systems  Constitutional: Negative for fever. Negative for chills. Eyes: Negative for visual changes. ENT: Negative for sore throat. Cardiovascular: Negative for chest pain. Respiratory: Negative for shortness of breath. Gastrointestinal: + foreign body sensation, vomiting Genitourinary: Negative for dysuria. Musculoskeletal: Negative for leg swelling. Skin: Negative for rash. Neurological: Negative for for headaches.   Physical Exam  Vital Signs: ED Triage Vitals  Enc Vitals Group     BP 11/13/18 1950 (!) 143/78     Pulse Rate 11/13/18 1950 84     Resp 11/13/18 1950 20     Temp 11/13/18 1950 98.8 F (37.1 C)     Temp Source 11/13/18 1950 Oral     SpO2 11/13/18 1950 97 %     Weight 11/13/18 1948 240 lb (108.9 kg)     Height 11/13/18 1948 6' (1.829 m)     Head Circumference --      Peak Flow --      Pain Score 11/13/18 1948 0     Pain Loc --      Pain Edu? --      Excl. in [place]? --     Constitutional: Alert and oriented.  Eyes: Conjunctivae clear. Sclera anicteric. Head: Normocephalic. Atraumatic. Nose: No congestion. No rhinorrhea. Mouth/Throat: Mucous membranes are moist.  Neck: No stridor.   Cardiovascular: Normal rate, regular rhythm. No murmurs. Extremities well perfused. Respiratory: Normal respiratory effort.  Lungs CTAB. Gastrointestinal: Soft and non-tender. No distention.  Musculoskeletal: No lower extremity edema. Neurologic:  Normal speech and language. No gross focal neurologic deficits are appreciated.  Skin: Skin is warm, dry and intact. No rash noted. Psychiatric: Mood and affect are appropriate for situation.  EKG  N/A    Radiology  N/A   Procedures  Procedure(s) performed (including critical care):  Procedures   Initial Impression / Assessment and Plan / ED  Course  53 y.o. male who presents to the ED with a esophageal foreign body sensation, inability to tolerate PO, after eating steak for dinner.  Exam is unremarkable. Tolerating secretions. VS within normal limits.   Attempted glucagon, with no improvement.  Failed PO challenge after both with water and coca cola, patient still regurgitating, vomiting fluids back up.  Will discuss with GI.  GI to scope at 7:30 AM. Patient updated on plan of care. COVID negative.   Final Clinical Impression(s) / ED Diagnosis  Final diagnoses:  Foreign body in esophagus, initial encounter       Note:  This document was prepared using Dragon voice recognition software and may include unintentional dictation errors.   Lilia Pro., MD 11/13/18  2335  

## 2018-11-14 ENCOUNTER — Ambulatory Visit: Admit: 2018-11-14 | Payer: BC Managed Care – PPO | Admitting: Gastroenterology

## 2018-11-14 ENCOUNTER — Encounter
Admission: EM | Disposition: A | Discharge status: Home / Self Care | Payer: Self-pay | Source: Home / Self Care | Attending: Student

## 2018-11-14 ENCOUNTER — Emergency Department: Payer: BC Managed Care – PPO | Admitting: Anesthesiology

## 2018-11-14 ENCOUNTER — Encounter: Payer: Self-pay | Admitting: Anesthesiology

## 2018-11-14 DIAGNOSIS — T18108A Unspecified foreign body in esophagus causing other injury, initial encounter: Secondary | ICD-10-CM | POA: Diagnosis not present

## 2018-11-14 DIAGNOSIS — J45909 Unspecified asthma, uncomplicated: Secondary | ICD-10-CM | POA: Diagnosis not present

## 2018-11-14 DIAGNOSIS — M1990 Unspecified osteoarthritis, unspecified site: Secondary | ICD-10-CM | POA: Diagnosis not present

## 2018-11-14 DIAGNOSIS — T18128A Food in esophagus causing other injury, initial encounter: Secondary | ICD-10-CM | POA: Diagnosis not present

## 2018-11-14 DIAGNOSIS — K209 Esophagitis, unspecified: Secondary | ICD-10-CM | POA: Diagnosis not present

## 2018-11-14 HISTORY — PX: ESOPHAGOGASTRODUODENOSCOPY (EGD) WITH PROPOFOL: SHX5813

## 2018-11-14 SURGERY — ESOPHAGOGASTRODUODENOSCOPY (EGD) WITH PROPOFOL
Anesthesia: General

## 2018-11-14 MED ORDER — DEXAMETHASONE SODIUM PHOSPHATE 10 MG/ML IJ SOLN
INTRAMUSCULAR | Status: DC | PRN
  Administered 2018-11-14: 10 mg via INTRAVENOUS

## 2018-11-14 MED ORDER — PROPOFOL 10 MG/ML IV BOLUS
INTRAVENOUS | Status: AC
  Filled 2018-11-14: qty 20

## 2018-11-14 MED ORDER — FENTANYL CITRATE (PF) 100 MCG/2ML IJ SOLN: 25.0000 ug | INTRAMUSCULAR | Status: DC | PRN

## 2018-11-14 MED ORDER — PROPOFOL 500 MG/50ML IV EMUL
INTRAVENOUS | Status: AC
  Filled 2018-11-14: qty 50

## 2018-11-14 MED ORDER — LIDOCAINE HCL (CARDIAC) PF 100 MG/5ML IV SOSY
PREFILLED_SYRINGE | INTRAVENOUS | Status: DC | PRN
  Administered 2018-11-14: 100 mg via INTRAVENOUS

## 2018-11-14 MED ORDER — OMEPRAZOLE 40 MG PO CPDR: 40.0000 mg | DELAYED_RELEASE_CAPSULE | Freq: Every day | ORAL | 2 refills | Status: DC

## 2018-11-14 MED ORDER — SUGAMMADEX SODIUM 500 MG/5ML IV SOLN
INTRAVENOUS | Status: AC
  Filled 2018-11-14: qty 5

## 2018-11-14 MED ORDER — FENTANYL CITRATE (PF) 100 MCG/2ML IJ SOLN
INTRAMUSCULAR | Status: AC
  Filled 2018-11-14: qty 2

## 2018-11-14 MED ORDER — SUCCINYLCHOLINE CHLORIDE 20 MG/ML IJ SOLN
INTRAMUSCULAR | Status: DC | PRN
  Administered 2018-11-14: 120 mg via INTRAVENOUS

## 2018-11-14 MED ORDER — ROCURONIUM BROMIDE 50 MG/5ML IV SOLN
INTRAVENOUS | Status: AC
  Filled 2018-11-14: qty 1

## 2018-11-14 MED ORDER — ROCURONIUM BROMIDE 100 MG/10ML IV SOLN
INTRAVENOUS | Status: DC | PRN
  Administered 2018-11-14: 50 mg via INTRAVENOUS

## 2018-11-14 MED ORDER — GLYCOPYRROLATE 0.2 MG/ML IJ SOLN
INTRAMUSCULAR | Status: AC
  Filled 2018-11-14: qty 2

## 2018-11-14 MED ORDER — LIDOCAINE HCL (PF) 2 % IJ SOLN
INTRAMUSCULAR | Status: AC
  Filled 2018-11-14: qty 10

## 2018-11-14 MED ORDER — SUGAMMADEX SODIUM 200 MG/2ML IV SOLN
INTRAVENOUS | Status: AC
  Filled 2018-11-14: qty 2

## 2018-11-14 MED ORDER — ESMOLOL HCL 100 MG/10ML IV SOLN
INTRAVENOUS | Status: DC | PRN
  Administered 2018-11-14: 20 mg via INTRAVENOUS

## 2018-11-14 MED ORDER — SUGAMMADEX SODIUM 500 MG/5ML IV SOLN
INTRAVENOUS | Status: DC | PRN
  Administered 2018-11-14: 430 mg via INTRAVENOUS

## 2018-11-14 MED ORDER — GLYCOPYRROLATE 0.2 MG/ML IJ SOLN
INTRAMUSCULAR | Status: DC | PRN
  Administered 2018-11-14: 0.2 mg via INTRAVENOUS

## 2018-11-14 MED ORDER — SUCCINYLCHOLINE CHLORIDE 20 MG/ML IJ SOLN
INTRAMUSCULAR | Status: AC
  Filled 2018-11-14: qty 1

## 2018-11-14 MED ORDER — ONDANSETRON HCL 4 MG/2ML IJ SOLN: 4.0000 mg | Freq: Once | INTRAMUSCULAR | Status: DC | PRN

## 2018-11-14 MED ORDER — PROPOFOL 10 MG/ML IV BOLUS
INTRAVENOUS | Status: DC | PRN
  Administered 2018-11-14 (×2): 100 mg via INTRAVENOUS

## 2018-11-14 MED ORDER — LACTATED RINGERS IV SOLN
INTRAVENOUS | Status: DC | PRN
  Administered 2018-11-14: 08:00:00 via INTRAVENOUS

## 2018-11-14 NOTE — Anesthesia Procedure Notes (Signed)
Procedure Name: Intubation Performed by: Kelton Pillar, CRNA Pre-anesthesia Checklist: Patient identified, Suction available, Patient being monitored and Emergency Drugs available Patient Re-evaluated:Patient Re-evaluated prior to induction Oxygen Delivery Method: Circle system utilized Preoxygenation: Pre-oxygenation with 100% oxygen Induction Type: IV induction and Rapid sequence Laryngoscope Size: McGraph and 4 Grade View: Grade I Tube type: Oral Tube size: 7.0 mm Number of attempts: 1 Airway Equipment and Method: Stylet Placement Confirmation: ETT inserted through vocal cords under direct vision,  positive ETCO2,  CO2 detector and breath sounds checked- equal and bilateral Secured at: 22 cm Tube secured with: Tape Dental Injury: Teeth and Oropharynx as per pre-operative assessment

## 2018-11-14 NOTE — H&P (Signed)
Cephas Darby, MD [address]  [place]  [place], [place] [address]  Main: [phone]  Fax: [phone] Pager: [phone]  Primary Care Physician:  Birdie Sons, MD Primary Gastroenterologist:  Dr. Cephas Darby  Pre-Procedure History & Physical: HPI:  Gilbert Lee is a 53 y.o. male is here for an endoscopy.   Past Medical History:  Diagnosis Date  . Asthma   . Gout     Past Surgical History:  Procedure Laterality Date  . COLONOSCOPY WITH PROPOFOL N/A 11/11/2015   Procedure: COLONOSCOPY WITH PROPOFOL;  Surgeon: Lucilla Lame, MD;  Location: [place];  Service: Endoscopy;  Laterality: N/A;  . KNEE ARTHROSCOPY Right   . POLYPECTOMY N/A 11/11/2015   Procedure: POLYPECTOMY;  Surgeon: Lucilla Lame, MD;  Location: [place];  Service: Endoscopy;  Laterality: N/A;    Prior to Admission medications   Medication Sig Start Date End Date Taking? Authorizing Provider  albuterol (PROVENTIL HFA;VENTOLIN HFA) 108 (90 Base) MCG/ACT inhaler Inhale 1-2 puffs into the lungs every 6 (six) hours as needed for shortness of breath. 06/11/18  Yes Birdie Sons, MD  allopurinol (ZYLOPRIM) 100 MG tablet Take 100 mg by mouth daily. Take with 300mg  tablet for 400mg  dose 03/29/17  Yes [provider]  allopurinol (ZYLOPRIM) 300 MG tablet TAKE 1 TABLET(300 MG) BY MOUTH DAILY Patient taking differently: Take 300 mg by mouth daily. Take with 100mg  tab for 400mg  dose 02/14/18  Yes Birdie Sons, MD  b complex vitamins tablet Take 1 tablet by mouth daily.   Yes [provider]  cholecalciferol (VITAMIN D) 25 MCG (1000 UT) tablet Take 1,000 Units by mouth daily.   Yes [provider]  rosuvastatin (CRESTOR) 20 MG tablet TAKE 1 TABLET(20 MG) BY MOUTH DAILY Patient taking differently: Take 20 mg by mouth daily.  01/01/18  Yes Birdie Sons, MD  TURMERIC PO Take 1 capsule by mouth daily.    Yes [provider]  zinc gluconate 50 MG  tablet Take 50 mg by mouth daily.   Yes [provider]  etodolac (LODINE) 500 MG tablet Take 500 mg by mouth 2 (two) times daily.    [provider]    Allergies as of 11/13/2018  . (No Known Allergies)    Family History  Problem Relation Age of Onset  . Other Father        intestinal obstruction  . Prostate cancer Father   . Parkinsonism Mother   . Deep vein thrombosis Brother   . Deep vein thrombosis Brother     Social History   Socioeconomic History  . Marital status: Married    Spouse name: Not on file  . Number of children: Not on file  . Years of education: Not on file  . Highest education level: Not on file  Occupational History  . Not on file  Social Needs  . Financial resource strain: Not on file  . Food insecurity    Worry: Not on file    Inability: Not on file  . Transportation needs    Medical: Not on file    Non-medical: Not on file  Tobacco Use  . Smoking status: Never Smoker  . Smokeless tobacco: Never Used  Substance and Sexual Activity  . Alcohol use: Yes    Alcohol/week: 0.0 standard drinks  . Drug use: No  . Sexual activity: Not on file  Lifestyle  . Physical activity    Days per week: Not  on file    Minutes per session: Not on file  . Stress: Not on file  Relationships  . Social Herbalist on phone: Not on file    Gets together: Not on file    Attends religious service: Not on file    Active member of club or organization: Not on file    Attends meetings of clubs or organizations: Not on file    Relationship status: Not on file  . Intimate partner violence    Fear of current or ex partner: Not on file    Emotionally abused: Not on file    Physically abused: Not on file    Forced sexual activity: Not on file  Other Topics Concern  . Not on file  Social History Narrative  . Not on file    Review of Systems: See HPI, otherwise negative ROS  Physical Exam: BP 128/82   Pulse 69   Temp (!) 96.7 F  (35.9 C)   Resp 16   Ht 6' (1.829 m)   Wt 108.9 kg   SpO2 99%   BMI 32.55 kg/m  General:   Alert,  pleasant and cooperative in NAD Head:  Normocephalic and atraumatic. Neck:  Supple; no masses or thyromegaly. Lungs:  Clear throughout to auscultation.    Heart:  Regular rate and rhythm. Abdomen:  Soft, nontender and nondistended. Normal bowel sounds, without guarding, and without rebound.   Neurologic:  Alert and  oriented x4;  grossly normal neurologically.  Impression/Plan: [name] is here for an endoscopy to be performed for food impaction  Risks, benefits, limitations, and alternatives regarding  endoscopy have been reviewed with the patient.  Questions have been answered.  All parties agreeable.   Sherri Sear, MD  11/14/2018, 7:32 AM

## 2018-11-14 NOTE — Anesthesia Postprocedure Evaluation (Signed)
Anesthesia Post Note  Patient: Gilbert Lee  Procedure(s) Performed: ESOPHAGOGASTRODUODENOSCOPY (EGD) WITH PROPOFOL (N/A )  Patient location during evaluation: Endoscopy Anesthesia Type: General Level of consciousness: awake and alert Pain management: pain level controlled Vital Signs Assessment: post-procedure vital signs reviewed and stable Respiratory status: spontaneous breathing, nonlabored ventilation, respiratory function stable and patient connected to nasal cannula oxygen Cardiovascular status: blood pressure returned to baseline and stable Postop Assessment: no apparent nausea or vomiting Anesthetic complications: no     Last Vitals:  Vitals:   11/14/18 0859 11/14/18 0910  BP: 122/79 127/88  Pulse: 82 76  Resp: (!) 22 16  Temp:  (!) 36.2 C  SpO2: 93% 95%    Last Pain:  Vitals:   11/14/18 0910  TempSrc:   PainSc: 3                  Therese Rocco S

## 2018-11-14 NOTE — Transfer of Care (Signed)
Immediate Anesthesia Transfer of Care Note  Patient: Gilbert Lee  Procedure(s) Performed: ESOPHAGOGASTRODUODENOSCOPY (EGD) WITH PROPOFOL (N/A )  Patient Location: PACU  Anesthesia Type:General  Level of Consciousness: awake, oriented and patient cooperative  Airway & Oxygen Therapy: Patient Spontanous Breathing and Patient connected to face mask oxygen  Post-op Assessment: Report given to RN and Post -op Vital signs reviewed and stable  Post vital signs: Reviewed and stable  Last Vitals:  Vitals Value Taken Time  BP 129/72 11/14/18 0845  Temp    Pulse 85 11/14/18 0845  Resp 19 11/14/18 0845  SpO2 100 % 11/14/18 0845  Vitals shown include unvalidated device data.  Last Pain:  Vitals:   11/14/18 0844  TempSrc:   PainSc: (P) Asleep         Complications: No apparent anesthesia complications

## 2018-11-14 NOTE — ED Notes (Signed)
Report given to Endoscopy. Pt departed for endoscopy at 0700

## 2018-11-14 NOTE — Anesthesia Preprocedure Evaluation (Signed)
Anesthesia Evaluation  Patient identified by MRN, date of birth, ID band Patient awake    Reviewed: Allergy & Precautions, NPO status , Patient's Chart, lab work & pertinent test results, reviewed documented beta blocker date and time   Airway Mallampati: III  TM Distance: >3 FB     Dental  (+) Chipped   Pulmonary asthma ,           Cardiovascular      Neuro/Psych    GI/Hepatic   Endo/Other    Renal/GU      Musculoskeletal  (+) Arthritis ,   Abdominal   Peds  Hematology   Anesthesia Other Findings Obese. Gout.  Reproductive/Obstetrics                             Anesthesia Physical Anesthesia Plan  ASA: III  Anesthesia Plan: General   Post-op Pain Management:    Induction: Intravenous  PONV Risk Score and Plan:   Airway Management Planned: Oral ETT  Additional Equipment:   Intra-op Plan:   Post-operative Plan:   Informed Consent: I have reviewed the patients History and Physical, chart, labs and discussed the procedure including the risks, benefits and alternatives for the proposed anesthesia with the patient or authorized representative who has indicated his/her understanding and acceptance.       Plan Discussed with: CRNA  Anesthesia Plan Comments:         Anesthesia Quick Evaluation

## 2018-11-14 NOTE — Consult Note (Signed)
Cephas Darby, MD [address]  [place]  [place], [place] [address]  Main: [phone]  Fax: [phone] Pager: [phone]   Consultation  Referring Provider:     No ref. provider found Primary Care Physician:  Birdie Sons, MD Primary Gastroenterologist:    None Reason for Consultation:     Food impaction  Date of Admission:  11/13/2018 Date of Consultation:  11/14/2018         HPI:   Gilbert Lee is a 53 y.o. male history of gout, hyperlipidemia presented to ER last night due to impaction of steak that he ate for dinner.  He reports having swallowed it without any issues and he was trying to drink water, he realized that the water was not going down.  He started spitting it up.  Therefore, he presented to ER.  He is able to swallow his secretions.  He denies chest pain, shortness of breath.  No similar episodes in the past.  He denies heartburn.  He is not on PPI as outpatient.  Patient was given coke in the ER with no relief COVID 19 test is negative  NSAIDs: None  Antiplts/Anticoagulants/Anti thrombotics: None   Past Medical History:  Diagnosis Date  . Asthma   . Gout     Past Surgical History:  Procedure Laterality Date  . COLONOSCOPY WITH PROPOFOL N/A 11/11/2015   Procedure: COLONOSCOPY WITH PROPOFOL;  Surgeon: Lucilla Lame, MD;  Location: [place];  Service: Endoscopy;  Laterality: N/A;  . KNEE ARTHROSCOPY Right   . POLYPECTOMY N/A 11/11/2015   Procedure: POLYPECTOMY;  Surgeon: Lucilla Lame, MD;  Location: [place];  Service: Endoscopy;  Laterality: N/A;    Prior to Admission medications   Medication Sig Start Date End Date Taking? Authorizing Provider  albuterol (PROVENTIL HFA;VENTOLIN HFA) 108 (90 Base) MCG/ACT inhaler Inhale 1-2 puffs into the lungs every 6 (six) hours as needed for shortness of breath. 06/11/18  Yes Birdie Sons, MD  allopurinol (ZYLOPRIM) 100 MG tablet Take 100 mg by mouth daily. Take with '300mg'$   tablet for '400mg'$  dose 03/29/17  Yes [provider]  allopurinol (ZYLOPRIM) 300 MG tablet TAKE 1 TABLET(300 MG) BY MOUTH DAILY Patient taking differently: Take 300 mg by mouth daily. Take with '100mg'$  tab for '400mg'$  dose 02/14/18  Yes Birdie Sons, MD  b complex vitamins tablet Take 1 tablet by mouth daily.   Yes [provider]  cholecalciferol (VITAMIN D) 25 MCG (1000 UT) tablet Take 1,000 Units by mouth daily.   Yes [provider]  rosuvastatin (CRESTOR) 20 MG tablet TAKE 1 TABLET(20 MG) BY MOUTH DAILY Patient taking differently: Take 20 mg by mouth daily.  01/01/18  Yes Birdie Sons, MD  TURMERIC PO Take 1 capsule by mouth daily.    Yes [provider]  zinc gluconate 50 MG tablet Take 50 mg by mouth daily.   Yes [provider]  etodolac (LODINE) 500 MG tablet Take 500 mg by mouth 2 (two) times daily.    [provider]   Current Facility-Administered Medications:  .  [MAR Hold] ondansetron (ZOFRAN) injection 4 mg, 4 mg, Intravenous, Once, Lilia Pro., MD   Family History  Problem Relation Age of Onset  . Other Father        intestinal obstruction  . Prostate cancer Father   . Parkinsonism Mother   . Deep vein thrombosis Brother   . Deep vein thrombosis Brother  Social History   Tobacco Use  . Smoking status: Never Smoker  . Smokeless tobacco: Never Used  Substance Use Topics  . Alcohol use: Yes    Alcohol/week: 0.0 standard drinks  . Drug use: No    Allergies as of 11/13/2018  . (No Known Allergies)    Review of Systems:    All systems reviewed and negative except where noted in HPI.   Physical Exam:  Vital signs in last 24 hours: Temp:  [96.7 F (35.9 C)-98.8 F (37.1 C)] 96.7 F (35.9 C) (08/27 0725) Pulse Rate:  [69-84] 69 (08/27 0725) Resp:  [16-20] 16 (08/27 0725) BP: (120-143)/(78-102) 128/82 (08/27 0725) SpO2:  [93 %-99 %] 99 % (08/27 0725) Weight:  [108.9 kg] 108.9 kg (08/27 0725)    General:   Pleasant, cooperative in NAD Head:  Normocephalic and atraumatic. Eyes:   No icterus.   Conjunctiva pink. PERRLA. Ears:  Normal auditory acuity. Neck:  Supple; no masses or thyroidomegaly Lungs: Respirations even and unlabored. Lungs clear to auscultation bilaterally.   No wheezes, crackles, or rhonchi.  Heart:  Regular rate and rhythm;  Without murmur, clicks, rubs or gallops Abdomen:  Soft, nondistended, nontender. Normal bowel sounds. No appreciable masses or hepatomegaly.  No rebound or guarding.  Rectal:  Not performed. Msk:  Symmetrical without gross deformities.  Strength Extremities:  Without edema, cyanosis or clubbing. Neurologic:  Alert and oriented x3;  grossly normal neurologically. Skin:  Intact without significant lesions or rashes. Psych:  Alert and cooperative. Normal affect.  LAB RESULTS: CBC Latest Ref Rng & Units 11/13/2018 12/18/2016  WBC 4.0 - 10.5 K/uL 11.0(H) 12.5(H)  Hemoglobin 13.0 - 17.0 g/dL 13.7 13.6  Hematocrit 39.0 - 52.0 % 41.0 40.1  Platelets 150 - 400 K/uL 175 198    BMET BMP Latest Ref Rng & Units 11/13/2018 12/12/2017 12/18/2016  Glucose 70 - 99 mg/dL 117(H) 100(H) 195(H)  BUN 6 - 20 mg/dL 22(H) 18 24(H)  Creatinine 0.61 - 1.24 mg/dL 1.01 1.08 1.26(H)  BUN/Creat Ratio 9 - 20 - 17 -  Sodium 135 - 145 mmol/L 142 145(H) 140  Potassium 3.5 - 5.1 mmol/L 3.8 4.5 4.0  Chloride 98 - 111 mmol/L 110 108(H) 105  CO2 22 - 32 mmol/L '26 22 26  '$ Calcium 8.9 - 10.3 mg/dL 8.8(L) 9.8 8.9    LFT Hepatic Function Latest Ref Rng & Units 11/13/2018 12/12/2017 10/11/2015  Total Protein 6.5 - 8.1 g/dL 6.4(L) 6.5 6.9  Albumin 3.5 - 5.0 g/dL 3.7 4.3 4.4  AST 15 - 41 U/L '22 22 19  '$ ALT 0 - 44 U/L 29 37 22  Alk Phosphatase 38 - 126 U/L 34(L) 47 50  Total Bilirubin 0.3 - 1.2 mg/dL 0.6 0.4 0.4  Bilirubin, Direct 0.0 - 0.2 mg/dL <0.1 - -     STUDIES: No results found.    Impression / Plan:   Gilbert Lee is a 53 y.o. male with history of gout,  hyperlipidemia presented with food impaction after eating steak for dinner last night  Will perform EGD PPI daily for about a month  I have discussed alternative options, risks & benefits,  which include, but are not limited to, bleeding, infection, perforation,respiratory complication & drug reaction.  The patient agrees with this plan & written consent will be obtained.     Thank you for involving me in the care of this patient.      LOS: 0 days   Sherri Sear, MD  11/14/2018, 7:36 AM  Note: This dictation was prepared with Dragon dictation along with smaller phrase technology. Any transcriptional errors that result from this process are unintentional.

## 2018-11-14 NOTE — Anesthesia Post-op Follow-up Note (Signed)
Anesthesia QCDR form completed.        

## 2018-11-15 ENCOUNTER — Encounter: Payer: Self-pay | Admitting: Gastroenterology

## 2018-11-18 NOTE — Op Note (Signed)
[facility] Gastroenterology Patient Name: Gilbert Lee Procedure Date: 11/14/2018 7:30 AM MRN: [id number] Account #: [id number] Date of Birth: Sep 03, 1965 Admit Type: Inpatient Age: 53 Room: [facility] ENDO ROOM 2 Gender: Male Note Status: Finalized Procedure:            Upper GI endoscopy Indications:          Foreign body in the esophagus Providers:            Lin Landsman MD, MD Medicines:            Monitored Anesthesia Care Complications:        No immediate complications. Estimated blood loss: None. Procedure:            Pre-Anesthesia Assessment:                       - Prior to the procedure, a History and Physical was                        performed, and patient medications and allergies were                        reviewed. The patient is competent. The risks and                        benefits of the procedure and the sedation options and                        risks were discussed with the patient. All questions                        were answered and informed consent was obtained.                        Patient identification and proposed procedure were                        verified by the physician, the nurse, the                        anesthesiologist, the anesthetist and the technician in                        the pre-procedure area in the procedure room in the                        endoscopy suite. Mental Status Examination: alert and                        oriented. Airway Examination: normal oropharyngeal                        airway and neck mobility. Respiratory Examination:                        clear to auscultation. CV Examination: normal.                        Prophylactic Antibiotics: The patient does not require  prophylactic antibiotics. Prior Anticoagulants: The                        patient has taken no previous anticoagulant or                        antiplatelet agents. ASA Grade Assessment: III - A                         patient with severe systemic disease. After reviewing                        the risks and benefits, the patient was deemed in                        satisfactory condition to undergo the procedure. The                        anesthesia plan was to use monitored anesthesia care                        (MAC). Immediately prior to administration of                        medications, the patient was re-assessed for adequacy                        to receive sedatives. The heart rate, respiratory rate,                        oxygen saturations, blood pressure, adequacy of                        pulmonary ventilation, and response to care were                        monitored throughout the procedure. The physical status                        of the patient was re-assessed after the procedure.                       After obtaining informed consent, the endoscope was                        passed under direct vision. Throughout the procedure,                        the patient's blood pressure, pulse, and oxygen                        saturations were monitored continuously. The Endoscope                        was introduced through the mouth, and advanced to the                        second part of duodenum. The upper GI endoscopy was  accomplished without difficulty. The patient tolerated                        the procedure well. Findings:      Food was found in the middle third of the esophagus and in the lower       third of the esophagus. Removal of food was accomplished.      Esophagitis with no bleeding was found in the lower third of the       esophagus.      The entire examined stomach was normal.      The cardia and gastric fundus were normal on retroflexion.      The duodenal bulb and second portion of the duodenum were normal. Impression:           - Food in the middle third of the esophagus and in the                        lower  third of the esophagus. Removal was successful.                       - Acute esophagitis.                       - Normal stomach.                       - Normal duodenal bulb and second portion of the                        duodenum. Recommendation:       - Discharge patient to home (with escort).                       - Chopped diet and mechanical soft diet today.                       - Continue present medications.                       - Follow an antireflux regimen.                       - Use Prilosec (omeprazole) 40 mg PO daily for 1 month.                       - Repeat upper endoscopy in 1 month to check healing. Procedure Code(s):    --- Professional ---                       [phone], Esophagogastroduodenoscopy, flexible, transoral;                        with removal of foreign body(s) Diagnosis Code(s):    --- Professional ---                       X64.680H, Food in esophagus causing other injury,                        initial encounter  K20.9, Esophagitis, unspecified                       T18.108A, Unspecified foreign body in esophagus causing                        other injury, initial encounter CPT copyright 2019 American Medical Association. All rights reserved. The codes documented in this report are preliminary and upon coder review may  be revised to meet current compliance requirements. Dr. Ulyess Mort Lin Landsman MD, MD 11/14/2018 8:32:36 AM This report has been signed electronically. Number of Addenda: 0 Note Initiated On: 11/14/2018 7:30 AM Estimated Blood Loss: Estimated blood loss: none.      [facility]

## 2018-11-22 MED ORDER — ONDANSETRON HCL 4 MG/2ML IJ SOLN
INTRAMUSCULAR | Status: AC
  Filled 2018-11-22: qty 2

## 2019-01-12 ENCOUNTER — Other Ambulatory Visit: Payer: Self-pay | Admitting: Family Medicine

## 2019-01-23 DIAGNOSIS — J4520 Mild intermittent asthma, uncomplicated: Secondary | ICD-10-CM | POA: Diagnosis not present

## 2019-01-23 DIAGNOSIS — Z1331 Encounter for screening for depression: Secondary | ICD-10-CM | POA: Diagnosis not present

## 2019-01-23 DIAGNOSIS — Z20828 Contact with and (suspected) exposure to other viral communicable diseases: Secondary | ICD-10-CM | POA: Diagnosis not present

## 2019-02-03 ENCOUNTER — Other Ambulatory Visit: Payer: Self-pay | Admitting: Family Medicine

## 2019-02-10 DIAGNOSIS — J4520 Mild intermittent asthma, uncomplicated: Secondary | ICD-10-CM | POA: Diagnosis not present

## 2019-02-10 DIAGNOSIS — Z20828 Contact with and (suspected) exposure to other viral communicable diseases: Secondary | ICD-10-CM | POA: Diagnosis not present

## 2019-02-27 ENCOUNTER — Other Ambulatory Visit: Payer: Self-pay | Admitting: Family Medicine

## 2019-02-27 NOTE — Telephone Encounter (Signed)
Requested medication (s) are due for refill today: yes  Requested medication (s) are on the active medication list: yes  Last refill:  02/02/2019  Future visit scheduled:no  Notes to clinic:  overdue for office visit  Review for refill   Requested Prescriptions  Pending Prescriptions Disp Refills   allopurinol (ZYLOPRIM) 300 MG tablet [Pharmacy Med Name: ALLOPURINOL 300 MG TABLET] 30 tablet 1    Sig: Take 1 tablet (300 mg total) by mouth daily. Take with 100mg  tab for 400mg  total a day      Endocrinology:  Gout Agents Failed - 02/27/2019 11:33 AM      Failed - Uric Acid in normal range and within 360 days    Uric Acid, Serum  Date Value Ref Range Status  01/16/2017 6.6 4.0 - 8.0 mg/dL Final    Comment:    Therapeutic target for gout patients: <6.0 mg/dL .    Uric Acid  Date Value Ref Range Status  10/11/2015 9.9 (H) 3.7 - 8.6 mg/dL Final    Comment:               Therapeutic target for gout patients: <6.0          Failed - Valid encounter within last 12 months    Recent Outpatient Visits           1 year ago Hyperlipidemia, unspecified hyperlipidemia type   [facility] Birdie Sons, MD   1 year ago Annual physical exam   [facility] Birdie Sons, MD   2 years ago Hyperuricemia   [facility] Birdie Sons, MD   3 years ago Right foot pain   [facility] Birdie Sons, MD   3 years ago Annual physical exam   [facility] Birdie Sons, MD              Passed - Cr in normal range and within 360 days    Creatinine, Ser  Date Value Ref Range Status  11/13/2018 1.01 0.61 - 1.24 mg/dL Final

## 2019-03-01 ENCOUNTER — Other Ambulatory Visit: Payer: Self-pay | Admitting: Family Medicine

## 2019-03-31 ENCOUNTER — Emergency Department
Admission: EM | Admit: 2019-03-31 | Discharge: 2019-03-31 | Disposition: A | Discharge status: Home / Self Care | Payer: BC Managed Care – PPO | Attending: Emergency Medicine | Admitting: Emergency Medicine

## 2019-03-31 ENCOUNTER — Encounter: Payer: Self-pay | Admitting: Emergency Medicine

## 2019-03-31 ENCOUNTER — Other Ambulatory Visit: Payer: Self-pay

## 2019-03-31 DIAGNOSIS — Z79899 Other long term (current) drug therapy: Secondary | ICD-10-CM | POA: Insufficient documentation

## 2019-03-31 DIAGNOSIS — D123 Benign neoplasm of transverse colon: Secondary | ICD-10-CM | POA: Diagnosis not present

## 2019-03-31 DIAGNOSIS — R0989 Other specified symptoms and signs involving the circulatory and respiratory systems: Secondary | ICD-10-CM | POA: Diagnosis not present

## 2019-03-31 DIAGNOSIS — K222 Esophageal obstruction: Secondary | ICD-10-CM | POA: Insufficient documentation

## 2019-03-31 DIAGNOSIS — J45909 Unspecified asthma, uncomplicated: Secondary | ICD-10-CM | POA: Diagnosis not present

## 2019-03-31 DIAGNOSIS — T18128A Food in esophagus causing other injury, initial encounter: Secondary | ICD-10-CM | POA: Diagnosis not present

## 2019-03-31 DIAGNOSIS — D125 Benign neoplasm of sigmoid colon: Secondary | ICD-10-CM | POA: Insufficient documentation

## 2019-03-31 NOTE — ED Notes (Signed)
EDP at bedside to assess. Pt given water to attempt to drink as he feels food bolus is now gone. Pt drinks without difficulty at this time.

## 2019-03-31 NOTE — ED Notes (Signed)
ED Provider at bedside. 

## 2019-03-31 NOTE — ED Notes (Addendum)
Pt alert and oriented X 4, stable for discharge. RR even and unlabored, color WNL. Discussed discharge instructions and follow up when appropriate. Instructed to follow up with ER for any life threatening symptoms or concerns that patient or family of patient may have  

## 2019-03-31 NOTE — ED Notes (Addendum)
Pt able to drink 6 oz soda and crackers without difficulty or emesis.

## 2019-03-31 NOTE — ED Triage Notes (Signed)
Patient ambulatory to triage with steady gait, without difficulty or distress noted, mask in place; pt reports eating steak PTA and now unable to swallow; similar incident 75mos ago requiring endoscopy

## 2019-03-31 NOTE — ED Provider Notes (Signed)
[facility] Emergency Department Provider Note  Time seen: 7:44 PM  I have reviewed the triage vital signs and the nursing notes.   HISTORY  Chief Complaint Foreign Body   HPI Gilbert Lee is a 54 y.o. male with a past medical history of asthma presents to the emergency department for an esophageal foreign body sensation.  According to the patient approximately 3 months ago patient was eating steak when he had a food bolus obstruction.  States he ended up coming to the ER and had an emergent endoscopy performed due to the obstruction.  States he was told to come back in 1 month for repeat endoscopy and dilation, but admits he never did so.  Patient states he has been doing well ever since then until tonight when he was once again eating steak and felt it get stuck.  Patient tried drinking water at home but it came back up.  Patient states he came to the emergency department, but began feeling better while waiting to be seen and feels like it may have passed at this time.   Past Medical History:  Diagnosis Date  . Asthma   . Gout     Patient Active Problem List   Diagnosis Date Noted  . Foreign body in esophagus   . Hyperuricemia 02/14/2016  . Benign neoplasm of transverse colon   . Benign neoplasm of sigmoid colon   . Allergic rhinitis 09/06/2015  . 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 Family history of malignant neoplasm of prostate 09/06/2015  . 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 Hyperlipidemia 09/06/2015  . Hemorrhoids, internal 09/06/2015  . Adiposity 09/06/2015    Past Surgical History:  Procedure Laterality Date  . COLONOSCOPY WITH PROPOFOL N/A 11/11/2015   Procedure: COLONOSCOPY WITH PROPOFOL;  Surgeon: Lucilla Lame, MD;  Location: [place];  Service: Endoscopy;  Laterality: N/A;  . ESOPHAGOGASTRODUODENOSCOPY (EGD) WITH PROPOFOL N/A 11/14/2018   Procedure: ESOPHAGOGASTRODUODENOSCOPY (EGD) WITH PROPOFOL;  Surgeon: Lin Landsman, MD;   Location: [facility] ENDOSCOPY;  Service: Gastroenterology;  Laterality: N/A;  . KNEE ARTHROSCOPY Right   . POLYPECTOMY N/A 11/11/2015   Procedure: POLYPECTOMY;  Surgeon: Lucilla Lame, MD;  Location: [place];  Service: Endoscopy;  Laterality: N/A;    Prior to Admission medications   Medication Sig Start Date End Date Taking? Authorizing Provider  albuterol (PROVENTIL HFA;VENTOLIN HFA) 108 (90 Base) MCG/ACT inhaler Inhale 1-2 puffs into the lungs every 6 (six) hours as needed for shortness of breath. 06/11/18   Birdie Sons, MD  allopurinol (ZYLOPRIM) 100 MG tablet Take 100 mg by mouth daily. Take with 300mg  tablet for 400mg  dose 03/29/17   [provider]  allopurinol (ZYLOPRIM) 300 MG tablet TAKE 1 TABLET (300 MG TOTAL) BY MOUTH DAILY. TAKE WITH 100MG  TAB FOR 400MG  TOTAL A DAY 02/28/19   Birdie Sons, MD  b complex vitamins tablet Take 1 tablet by mouth daily.    [provider]  cholecalciferol (VITAMIN D) 25 MCG (1000 UT) tablet Take 1,000 Units by mouth daily.    [provider]  omeprazole (PRILOSEC) 40 MG capsule Take 1 capsule (40 mg total) by mouth daily before breakfast. 11/14/18 12/14/18  Lin Landsman, MD  rosuvastatin (CRESTOR) 20 MG tablet TAKE 1 TABLET BY MOUTH EVERY DAY 03/01/19   Birdie Sons, MD  TURMERIC PO Take 1 capsule by mouth daily.     [provider]  zinc gluconate 50 MG tablet Take 50 mg by mouth daily.  [provider]    No Known Allergies  Family History  Problem Relation Age of Onset  . Other Father        intestinal obstruction  . Prostate cancer Father   . Parkinsonism Mother   . Deep vein thrombosis Brother   . Deep vein thrombosis Brother     Social History Social History   Tobacco Use  . Smoking status: Never Smoker  . Smokeless tobacco: Never Used  Substance Use Topics  . Alcohol use: Yes    Alcohol/week: 0.0 standard drinks  . Drug use: No    Review of  Systems Constitutional: Negative for fever. Cardiovascular: Negative for chest pain. Respiratory: Negative for shortness of breath. Gastrointestinal: Negative for abdominal pain.  Positive for vomiting, but only after the food bolus obstruction and trying to drink water Musculoskeletal: Negative for musculoskeletal complaints Neurological: Negative for headache All other ROS negative  ____________________________________________   PHYSICAL EXAM:  VITAL SIGNS: ED Triage Vitals  Enc Vitals Group     BP 03/31/19 1928 (!) 125/59     Pulse Rate 03/31/19 1928 92     Resp 03/31/19 1928 18     Temp 03/31/19 1928 99.1 F (37.3 C)     Temp Source 03/31/19 1928 Oral     SpO2 03/31/19 1928 96 %     Weight 03/31/19 1926 245 lb (111.1 kg)     Height 03/31/19 1926 6' (1.829 m)     Head Circumference --      Peak Flow --      Pain Score 03/31/19 1926 7     Pain Loc --      Pain Edu? --      Excl. in [place]? --    Constitutional: Alert and oriented. Well appearing and in no distress. Eyes: Normal exam ENT      Head: Normocephalic and atraumatic.      Mouth/Throat: Mucous membranes are moist. Cardiovascular: Normal rate, regular rhythm. Respiratory: Normal respiratory effort without tachypnea nor retractions. Breath sounds are clear  Gastrointestinal: Soft and nontender. No distention.   Musculoskeletal: Nontender with normal range of motion in all extremities Neurologic:  Normal speech and language. No gross focal neurologic deficits  Skin:  Skin is warm, dry and intact.  Psychiatric: Mood and affect are normal.    INITIAL IMPRESSION / ASSESSMENT AND PLAN / ED COURSE  Pertinent labs & imaging results that were available during my care of the patient were reviewed by me and considered in my medical decision making (see chart for details).   Patient presents to the emergency department for possible food bolus obstruction.  Patient states he was eating steak tonight when he felt like he  got stuck.  Tried to drink water but it came back up.  Similar thing happened 3 months ago but the patient did not follow-up for a dilation.  Sounds like the patient likely has esophageal stricturing and would benefit from endoscopy and dilation.  Patient states he is feeling better now.  We will attempt to orally hydrate the patient and see if he is able to keep water down if he is we will attempt to keep solids down.  As long as the patient is able to eat and drink normally I would anticipate discharge home with follow-up in the office with Dr. Marius Ditch.   Patient is able to tolerate water, soda, crackers, no issues.  Feels like the obstruction has passed.  Patient appears well overall.  Patient is requesting  to follow-up with Dr. Lucilla Lame, as his wife has seen him in the past.  We will refer to him for evaluation and possible endoscopy/dilation.  I discussed with the patient till then adhering to a softer diet in chewing adequately.  DESIRE PEDDER was evaluated in Emergency Department on 03/31/2019 for the symptoms described in the history of present illness. He was evaluated in the context of the global COVID-19 pandemic, which necessitated consideration that the patient might be at risk for infection with the SARS-CoV-2 virus that causes COVID-19. Institutional protocols and algorithms that pertain to the evaluation of patients at risk for COVID-19 are in a state of rapid change based on information released by regulatory bodies including the CDC and federal and state organizations. These policies and algorithms were followed during the patient's care in the ED.  ____________________________________________   FINAL CLINICAL IMPRESSION(S) / ED DIAGNOSES  Food bolus obstruction Esophageal stricture   Harvest Dark, MD 03/31/19 2025

## 2019-03-31 NOTE — ED Notes (Signed)
Pt able to drink 6 oz water without difficulty. Pt now drinking soda and will attempt to eat crackers. Requested that this RN call his wife to update her.

## 2019-04-02 ENCOUNTER — Telehealth: Payer: Self-pay | Admitting: Gastroenterology

## 2019-04-02 ENCOUNTER — Other Ambulatory Visit: Payer: Self-pay

## 2019-04-02 DIAGNOSIS — K222 Esophageal obstruction: Secondary | ICD-10-CM

## 2019-04-02 NOTE — Telephone Encounter (Signed)
PATIENT CALLED TO SCHEDULE A UPPER ENDOSCOPY F/U FROM THE ED. HE HAS PREVIOUSLY SEEN DR VANGA IN THE HOSPITAL WHERE SHE DID AN UPPER ENDOSCOPY & WANTED HIM TO RETURN IN 1 MONTH.

## 2019-04-02 NOTE — Telephone Encounter (Signed)
Pt has been scheduled for 04/09/2019, pt has been notified and verbalized understanding

## 2019-04-07 ENCOUNTER — Other Ambulatory Visit
Admission: RE | Admit: 2019-04-07 | Discharge: 2019-04-07 | Disposition: A | Discharge status: Home / Self Care | Payer: BC Managed Care – PPO | Source: Ambulatory Visit | Attending: Gastroenterology | Admitting: Gastroenterology

## 2019-04-07 DIAGNOSIS — Z01812 Encounter for preprocedural laboratory examination: Secondary | ICD-10-CM | POA: Diagnosis not present

## 2019-04-07 DIAGNOSIS — Z20822 Contact with and (suspected) exposure to covid-19: Secondary | ICD-10-CM | POA: Diagnosis not present

## 2019-04-07 LAB — SARS CORONAVIRUS 2 (TAT 6-24 HRS): SARS Coronavirus 2: NEGATIVE

## 2019-04-08 ENCOUNTER — Encounter: Payer: Self-pay | Admitting: Gastroenterology

## 2019-04-09 ENCOUNTER — Encounter
Admission: RE | Disposition: A | Discharge status: Home / Self Care | Payer: Self-pay | Source: Home / Self Care | Attending: Gastroenterology

## 2019-04-09 ENCOUNTER — Ambulatory Visit: Payer: BC Managed Care – PPO | Admitting: Anesthesiology

## 2019-04-09 ENCOUNTER — Ambulatory Visit
Admission: RE | Admit: 2019-04-09 | Discharge: 2019-04-09 | Disposition: A | Discharge status: Home / Self Care | Payer: BC Managed Care – PPO | Attending: Gastroenterology | Admitting: Gastroenterology

## 2019-04-09 DIAGNOSIS — Z8249 Family history of ischemic heart disease and other diseases of the circulatory system: Secondary | ICD-10-CM | POA: Diagnosis not present

## 2019-04-09 DIAGNOSIS — M109 Gout, unspecified: Secondary | ICD-10-CM | POA: Insufficient documentation

## 2019-04-09 DIAGNOSIS — J45909 Unspecified asthma, uncomplicated: Secondary | ICD-10-CM | POA: Diagnosis not present

## 2019-04-09 DIAGNOSIS — R1310 Dysphagia, unspecified: Secondary | ICD-10-CM | POA: Diagnosis not present

## 2019-04-09 DIAGNOSIS — K449 Diaphragmatic hernia without obstruction or gangrene: Secondary | ICD-10-CM | POA: Insufficient documentation

## 2019-04-09 DIAGNOSIS — L83 Acanthosis nigricans: Secondary | ICD-10-CM | POA: Insufficient documentation

## 2019-04-09 DIAGNOSIS — K222 Esophageal obstruction: Secondary | ICD-10-CM

## 2019-04-09 DIAGNOSIS — Z86010 Personal history of colonic polyps: Secondary | ICD-10-CM | POA: Insufficient documentation

## 2019-04-09 DIAGNOSIS — R1314 Dysphagia, pharyngoesophageal phase: Secondary | ICD-10-CM | POA: Diagnosis not present

## 2019-04-09 DIAGNOSIS — K209 Esophagitis, unspecified without bleeding: Secondary | ICD-10-CM | POA: Diagnosis not present

## 2019-04-09 DIAGNOSIS — Z79899 Other long term (current) drug therapy: Secondary | ICD-10-CM | POA: Insufficient documentation

## 2019-04-09 DIAGNOSIS — Z820 Family history of epilepsy and other diseases of the nervous system: Secondary | ICD-10-CM | POA: Insufficient documentation

## 2019-04-09 DIAGNOSIS — K2970 Gastritis, unspecified, without bleeding: Secondary | ICD-10-CM | POA: Diagnosis not present

## 2019-04-09 DIAGNOSIS — K2100 Gastro-esophageal reflux disease with esophagitis, without bleeding: Secondary | ICD-10-CM | POA: Insufficient documentation

## 2019-04-09 DIAGNOSIS — Z8042 Family history of malignant neoplasm of prostate: Secondary | ICD-10-CM | POA: Insufficient documentation

## 2019-04-09 DIAGNOSIS — R1319 Other dysphagia: Secondary | ICD-10-CM

## 2019-04-09 HISTORY — PX: ESOPHAGOGASTRODUODENOSCOPY (EGD) WITH PROPOFOL: SHX5813

## 2019-04-09 SURGERY — ESOPHAGOGASTRODUODENOSCOPY (EGD) WITH PROPOFOL
Anesthesia: General

## 2019-04-09 MED ORDER — SODIUM CHLORIDE 0.9 % IV SOLN
INTRAVENOUS | Status: DC
  Administered 2019-04-09: 10:00:00 1000 mL via INTRAVENOUS

## 2019-04-09 MED ORDER — PROPOFOL 10 MG/ML IV BOLUS
INTRAVENOUS | Status: DC | PRN
  Administered 2019-04-09 (×2): 30 mg via INTRAVENOUS

## 2019-04-09 MED ORDER — LIDOCAINE HCL (CARDIAC) PF 100 MG/5ML IV SOSY
PREFILLED_SYRINGE | INTRAVENOUS | Status: DC | PRN
  Administered 2019-04-09: 100 mg via INTRATRACHEAL

## 2019-04-09 MED ORDER — GLYCOPYRROLATE 0.2 MG/ML IJ SOLN
INTRAMUSCULAR | Status: DC | PRN
  Administered 2019-04-09: .2 mg via INTRAVENOUS

## 2019-04-09 MED ORDER — OMEPRAZOLE 40 MG PO CPDR: 40.0000 mg | DELAYED_RELEASE_CAPSULE | Freq: Two times a day (BID) | ORAL | 2 refills | Status: DC

## 2019-04-09 MED ORDER — PROPOFOL 500 MG/50ML IV EMUL
INTRAVENOUS | Status: DC | PRN
  Administered 2019-04-09: 170 ug/kg/min via INTRAVENOUS

## 2019-04-09 NOTE — Op Note (Addendum)
[facility] Gastroenterology Patient Name: Gilbert Lee Procedure Date: 04/09/2019 10:20 AM MRN: [id number] Account #: [id number] Date of Birth: 1966-02-12 Admit Type: Outpatient Age: 54 Room: [facility] ENDO ROOM 2 Gender: Male Note Status: Addendum Procedure:             Upper GI endoscopy Indications:           Esophageal dysphagia Providers:             Lin Landsman MD, MD Referring MD:          Kirstie Peri. Caryn Section, MD (Referring MD) Medicines:             Monitored Anesthesia Care Complications:         No immediate complications. Estimated blood loss: None. Procedure:             Pre-Anesthesia Assessment:                        - Prior to the procedure, a History and Physical was                         performed, and patient medications and allergies were                         reviewed. The patient is competent. The risks and                         benefits of the procedure and the sedation options and                         risks were discussed with the patient. All questions                         were answered and informed consent was obtained.                         Patient identification and proposed procedure were                         verified by the physician, the nurse, the                         anesthesiologist, the anesthetist and the technician                         in the pre-procedure area in the procedure room in the                         endoscopy suite. Mental Status Examination: alert and                         oriented. Airway Examination: normal oropharyngeal                         airway and neck mobility. Respiratory Examination:                         clear to auscultation. CV Examination: normal.  Prophylactic Antibiotics: The patient does not require                         prophylactic antibiotics. Prior Anticoagulants: The                         patient has taken no previous anticoagulant or                          antiplatelet agents. ASA Grade Assessment: II - A                         patient with mild systemic disease. After reviewing                         the risks and benefits, the patient was deemed in                         satisfactory condition to undergo the procedure. The                         anesthesia plan was to use monitored anesthesia care                         (MAC). Immediately prior to administration of                         medications, the patient was re-assessed for adequacy                         to receive sedatives. The heart rate, respiratory                         rate, oxygen saturations, blood pressure, adequacy of                         pulmonary ventilation, and response to care were                         monitored throughout the procedure. The physical                         status of the patient was re-assessed after the                         procedure.                        After obtaining informed consent, the endoscope was                         passed under direct vision. Throughout the procedure,                         the patient's blood pressure, pulse, and oxygen                         saturations were monitored continuously. The Endoscope  was introduced through the mouth, and advanced to the                         second part of duodenum. The upper GI endoscopy was                         accomplished without difficulty. The patient tolerated                         the procedure well. Findings:      The duodenal bulb and second portion of the duodenum were normal.      A small hiatal hernia was present.      The stomach was normal.      The cardia and gastric fundus were normal on retroflexion.      LA Grade A (one or more mucosal breaks less than 5 mm, not extending       between tops of 2 mucosal folds) esophagitis with no bleeding was found       in the lower third of the  esophagus.      One benign-appearing, intrinsic mild stenosis was found 40 cm from the       incisors. The stenosis was traversed. The dilation site was examined       following endoscope reinsertion and showed mild mucosal disruption and       mild improvement in luminal narrowing. Estimated blood loss: none.      The rest of examined esophagus was normal. Biopsies were obtained from       the proximal and distal esophagus with cold forceps for histology of       suspected eosinophilic esophagitis. Impression:            - Normal duodenal bulb and second portion of the                         duodenum.                        - Small hiatal hernia.                        - Normal stomach.                        - LA Grade A reflux esophagitis with no bleeding.                        - Benign-appearing esophageal stenosis.                        - Normal esophagus. Biopsied. Recommendation:        - Discharge patient to home (with escort).                        - Low fat diet and low sodium diet.                        - Continue present medications.                        - Await pathology results.                        -  Follow an antireflux regimen.                        - Use Prilosec (omeprazole) 40 mg PO BID for 3 months.                        - Return to my office in 1 month. Procedure Code(s):     --- Professional ---                        [phone], Esophagogastroduodenoscopy, flexible,                         transoral; with biopsy, single or multiple Diagnosis Code(s):     --- Professional ---                        K44.9, Diaphragmatic hernia without obstruction or                         gangrene                        K21.00, Gastro-esophageal reflux disease with                         esophagitis, without bleeding                        K22.2, Esophageal obstruction                        R13.14, Dysphagia, pharyngoesophageal phase CPT copyright 2019 American Medical  Association. All rights reserved. The codes documented in this report are preliminary and upon coder review may  be revised to meet current compliance requirements. Dr. Ulyess Mort Lin Landsman MD, MD 04/09/2019 10:59:39 AM This report has been signed electronically. Number of Addenda: 1 Note Initiated On: 04/09/2019 10:20 AM Estimated Blood Loss:  Estimated blood loss: none.      [facility] Addendum Number: 1   Addendum Date: 04/11/2019 1:13:28 PM      TTS balloon was used to dilate the GE junction stricture, dilated to 50m Dr. RMilford CageMD, MD 04/11/2019 1:16:16 PM This report has been signed electronically.

## 2019-04-09 NOTE — Anesthesia Postprocedure Evaluation (Signed)
Anesthesia Post Note  Patient: Gilbert Lee  Procedure(s) Performed: ESOPHAGOGASTRODUODENOSCOPY (EGD) WITH PROPOFOL (N/A )  Patient location during evaluation: Endoscopy Anesthesia Type: General Level of consciousness: awake and alert Pain management: pain level controlled Vital Signs Assessment: post-procedure vital signs reviewed and stable Respiratory status: spontaneous breathing and respiratory function stable Cardiovascular status: stable Anesthetic complications: no     Last Vitals:  Vitals:   04/09/19 0921 04/09/19 1057  BP: 140/87   Pulse: 80   Resp: 18   Temp: (!) 36.2 C (!) 36.1 C  SpO2: 99%     Last Pain:  Vitals:   04/09/19 1057  TempSrc: Temporal  PainSc: Asleep                 Aleda Madl K

## 2019-04-09 NOTE — H&P (Signed)
Cephas Darby, MD [address]  [place]  [place], [place] [address]  Main: [phone]  Fax: [phone] Pager: [phone]  Primary Care Physician:  Birdie Sons, MD Primary Gastroenterologist:  Dr. Cephas Darby  Pre-Procedure History & Physical: HPI:  Gilbert Lee is a 53 y.o. male is here for an endoscopy.   Past Medical History:  Diagnosis Date  . Asthma   . Gout     Past Surgical History:  Procedure Laterality Date  . COLONOSCOPY WITH PROPOFOL N/A 11/11/2015   Procedure: COLONOSCOPY WITH PROPOFOL;  Surgeon: Lucilla Lame, MD;  Location: [facility];  Service: Endoscopy;  Laterality: N/A;  . ESOPHAGOGASTRODUODENOSCOPY (EGD) WITH PROPOFOL N/A 11/14/2018   Procedure: ESOPHAGOGASTRODUODENOSCOPY (EGD) WITH PROPOFOL;  Surgeon: Lin Landsman, MD;  Location: [facility] ENDOSCOPY;  Service: Gastroenterology;  Laterality: N/A;  . KNEE ARTHROSCOPY Right   . POLYPECTOMY N/A 11/11/2015   Procedure: POLYPECTOMY;  Surgeon: Lucilla Lame, MD;  Location: [facility];  Service: Endoscopy;  Laterality: N/A;    Prior to Admission medications   Medication Sig Start Date End Date Taking? Authorizing Provider  albuterol (PROVENTIL HFA;VENTOLIN HFA) 108 (90 Base) MCG/ACT inhaler Inhale 1-2 puffs into the lungs every 6 (six) hours as needed for shortness of breath. 06/11/18  Yes Birdie Sons, MD  allopurinol (ZYLOPRIM) 100 MG tablet Take 100 mg by mouth daily. Take with 300mg  tablet for 400mg  dose 03/29/17  Yes [provider]  allopurinol (ZYLOPRIM) 300 MG tablet TAKE 1 TABLET (300 MG TOTAL) BY MOUTH DAILY. TAKE WITH 100MG  TAB FOR 400MG  TOTAL A DAY 02/28/19  Yes Birdie Sons, MD  b complex vitamins tablet Take 1 tablet by mouth daily.   Yes [provider]  cholecalciferol (VITAMIN D) 25 MCG (1000 UT) tablet Take 1,000 Units by mouth daily.   Yes [provider]  rosuvastatin (CRESTOR) 20 MG tablet TAKE 1 TABLET BY MOUTH EVERY DAY  03/01/19  Yes Birdie Sons, MD  TURMERIC PO Take 1 capsule by mouth daily.    Yes [provider]  zinc gluconate 50 MG tablet Take 50 mg by mouth daily.   Yes [provider]  omeprazole (PRILOSEC) 40 MG capsule Take 1 capsule (40 mg total) by mouth daily before breakfast. 11/14/18 12/14/18  Lin Landsman, MD    Allergies as of 04/02/2019  . (No Known Allergies)    Family History  Problem Relation Age of Onset  . Other Father        intestinal obstruction  . Prostate cancer Father   . Parkinsonism Mother   . Deep vein thrombosis Brother   . Deep vein thrombosis Brother     Social History   Socioeconomic History  . Marital status: Married    Spouse name: Not on file  . Number of children: Not on file  . Years of education: Not on file  . Highest education level: Not on file  Occupational History  . Not on file  Tobacco Use  . Smoking status: Never Smoker  . Smokeless tobacco: Never Used  Substance and Sexual Activity  . Alcohol use: Yes    Alcohol/week: 0.0 standard drinks  . Drug use: No  . Sexual activity: Not on file  Other Topics Concern  . Not on file  Social History Narrative  . Not on file   Social Determinants of Health   Financial Resource Strain:   . Difficulty of Paying Living Expenses: Not on file  Food Insecurity:   . Worried About Charity fundraiser in the Last Year: Not on file  . Ran Out of Food in the Last Year: Not on file  Transportation Needs:   . Lack of Transportation (Medical): Not on file  . Lack of Transportation (Non-Medical): Not on file  Physical Activity:   . Days of Exercise per Week: Not on file  . Minutes of Exercise per Session: Not on file  Stress:   . Feeling of Stress : Not on file  Social Connections:   . Frequency of Communication with Friends and Family: Not on file  . Frequency of Social Gatherings with Friends and Family: Not on file  . Attends Religious Services: Not on file  . Active  Member of Clubs or Organizations: Not on file  . Attends Archivist Meetings: Not on file  . Marital Status: Not on file  Intimate Partner Violence:   . Fear of Current or Ex-Partner: Not on file  . Emotionally Abused: Not on file  . Physically Abused: Not on file  . Sexually Abused: Not on file    Review of Systems: See HPI, otherwise negative ROS  Physical Exam: BP 140/87   Pulse 80   Temp (!) 97.2 F (36.2 C) (Tympanic)   Resp 18   Ht 5\' 11"  (1.803 m)   Wt 111.1 kg   SpO2 99%   BMI 34.17 kg/m  General:   Alert,  pleasant and cooperative in NAD Head:  Normocephalic and atraumatic. Neck:  Supple; no masses or thyromegaly. Lungs:  Clear throughout to auscultation.    Heart:  Regular rate and rhythm. Abdomen:  Soft, nontender and nondistended. Normal bowel sounds, without guarding, and without rebound.   Neurologic:  Alert and  oriented x4;  grossly normal neurologically.  Impression/Plan: Gilbert Lee is here for an endoscopy to be performed for dysphagia  Risks, benefits, limitations, and alternatives regarding  endoscopy have been reviewed with the patient.  Questions have been answered.  All parties agreeable.   Sherri Sear, MD  04/09/2019, 9:28 AM

## 2019-04-09 NOTE — Transfer of Care (Signed)
Immediate Anesthesia Transfer of Care Note  Patient: STEAVEN ARN  Procedure(s) Performed: ESOPHAGOGASTRODUODENOSCOPY (EGD) WITH PROPOFOL (N/A )  Patient Location: Endoscopy Unit  Anesthesia Type:General  Level of Consciousness: drowsy, patient cooperative and responds to stimulation  Airway & Oxygen Therapy: Patient Spontanous Breathing and Patient connected to face mask oxygen  Post-op Assessment: Report given to RN and Post -op Vital signs reviewed and stable  Post vital signs: Reviewed and stable  Last Vitals:  Vitals Value Taken Time  BP 112/80 04/09/19 1057  Temp 36.1 C 04/09/19 1057  Pulse 67 04/09/19 1057  Resp 15 04/09/19 1057  SpO2 96 % 04/09/19 1057  Vitals shown include unvalidated device data.  Last Pain:  Vitals:   04/09/19 0921  TempSrc: Tympanic  PainSc: 0-No pain         Complications: No apparent anesthesia complications

## 2019-04-09 NOTE — Anesthesia Preprocedure Evaluation (Signed)
Anesthesia Evaluation  Patient identified by MRN, date of birth, ID band Patient awake    Reviewed: Allergy & Precautions, NPO status , Patient's Chart, lab work & pertinent test results  History of Anesthesia Complications Negative for: history of anesthetic complications  Airway Mallampati: II       Dental   Pulmonary asthma , neg sleep apnea, Not current smoker,           Cardiovascular (-) hypertension(-) Past MI and (-) CHF (-) dysrhythmias (-) Valvular Problems/Murmurs     Neuro/Psych neg Seizures    GI/Hepatic Neg liver ROS, neg GERD  ,  Endo/Other  neg diabetes  Renal/GU negative Renal ROS     Musculoskeletal   Abdominal   Peds  Hematology   Anesthesia Other Findings   Reproductive/Obstetrics                             Anesthesia Physical Anesthesia Plan  ASA: II  Anesthesia Plan: General   Post-op Pain Management:    Induction: Intravenous  PONV Risk Score and Plan: 2 and Propofol infusion and TIVA  Airway Management Planned: Nasal Cannula  Additional Equipment:   Intra-op Plan:   Post-operative Plan:   Informed Consent: I have reviewed the patients History and Physical, chart, labs and discussed the procedure including the risks, benefits and alternatives for the proposed anesthesia with the patient or authorized representative who has indicated his/her understanding and acceptance.       Plan Discussed with:   Anesthesia Plan Comments:         Anesthesia Quick Evaluation

## 2019-04-10 ENCOUNTER — Telehealth: Payer: Self-pay | Admitting: Gastroenterology

## 2019-04-10 ENCOUNTER — Encounter: Payer: Self-pay | Admitting: *Deleted

## 2019-04-10 LAB — SURGICAL PATHOLOGY

## 2019-04-10 NOTE — Telephone Encounter (Signed)
-----   Message from Lin Landsman, MD sent at 04/10/2019  4:13 PM EST ----- Regarding: Follow-up Please make a follow-up to see me in 1 to 2 months Dx: GERD, dysphagiaRohini Vanga

## 2019-04-10 NOTE — Telephone Encounter (Signed)
Pt is scheduled for 05/26/19 with Dr. Marius Ditch

## 2019-04-14 ENCOUNTER — Telehealth: Payer: Self-pay | Admitting: Gastroenterology

## 2019-04-14 NOTE — Telephone Encounter (Signed)
Pt left vm he has a question regarding his restrictions that Dr. Marius Ditch had given him for the next 6 weeks please call pt

## 2019-04-23 ENCOUNTER — Encounter: Payer: Self-pay | Admitting: Gastroenterology

## 2019-04-25 ENCOUNTER — Other Ambulatory Visit: Payer: BC Managed Care – PPO

## 2019-04-28 ENCOUNTER — Ambulatory Visit: Payer: BC Managed Care – PPO | Attending: Internal Medicine

## 2019-04-28 DIAGNOSIS — Z20822 Contact with and (suspected) exposure to covid-19: Secondary | ICD-10-CM | POA: Diagnosis not present

## 2019-04-29 LAB — NOVEL CORONAVIRUS, NAA: SARS-CoV-2, NAA: NOT DETECTED

## 2019-05-01 ENCOUNTER — Ambulatory Visit: Payer: Self-pay | Admitting: *Deleted

## 2019-05-01 NOTE — Telephone Encounter (Signed)
Pt is requesting a call back from nurses for medication clarification   2 colchicine 0.6 MG tablet - pt took 2 at 5:00 pm   1 crestor daily rosuvastatin (CRESTOR) 20 MG tablet - Pt took one tablet this morning,   He is worried that he is danger because he took both of these medications.   Returned call to patient regarding his taking the colchicine and rosuvastatin. He is advised per reference, taking the two concurrently may cause myopathy or rhabdomylosis .\ He stated he only took it once. But is having a gout flare is why he took the colchicine. He will call back in the morning if his knee has not gone down for an appointment. Routing to [employer].

## 2019-05-01 NOTE — Telephone Encounter (Signed)
  Reason for Disposition . Caller has medication question, adult has minor symptoms, caller declines triage, AND triager answers question  Answer Assessment - Initial Assessment Questions 1.   NAME of MEDICATION: "What medicine are you calling about?"     colchicine and rosuvastatin  2.   QUESTION: "What is your question?"     It will hurt to take them the same day. 3.   PRESCRIBING HCP: "Who prescribed it?" Reason: if prescribed by specialist, call should be referred to that group.      4. SYMPTOMS: "Do you have any symptoms?"     Not from medication but feels like a gout flare 5. SEVERITY: If symptoms are present, ask "Are they mild, moderate or severe?"    mild 6.  PREGNANCY:  "Is there any chance that you are pregnant?" "When was your last menstrual period?"    N/A  Protocols used: MEDICATION QUESTION CALL-A-AH

## 2019-05-02 ENCOUNTER — Encounter: Payer: Self-pay | Admitting: Gastroenterology

## 2019-05-02 ENCOUNTER — Telehealth: Payer: Self-pay | Admitting: Family Medicine

## 2019-05-02 DIAGNOSIS — M25461 Effusion, right knee: Secondary | ICD-10-CM | POA: Diagnosis not present

## 2019-05-02 DIAGNOSIS — M109 Gout, unspecified: Secondary | ICD-10-CM | POA: Diagnosis not present

## 2019-05-02 DIAGNOSIS — M1711 Unilateral primary osteoarthritis, right knee: Secondary | ICD-10-CM | POA: Diagnosis not present

## 2019-05-02 NOTE — Telephone Encounter (Signed)
Patient read note of Dr Caryn Section dated today, 05/02/19. He verbalized understanding.  He states he has stopped the colchicine. He only took it a few times. He had his knee drained by [facility] today.  GI doctor has stopped Prilosec stating that it can be the source of his gout flares.

## 2019-05-02 NOTE — Telephone Encounter (Signed)
LMTCB, okay for PEC to advise patient.  

## 2019-05-02 NOTE — Telephone Encounter (Signed)
Attempted to call patient with PCP response- left message to call back

## 2019-05-02 NOTE — Telephone Encounter (Signed)
One of the potential side effects of rosuvastatin is muscle soreness and inflammation. Taking colchicine can cause the rosuvastatin to build up in the blood stream, which might make those side effects more likely to occur.   Taking the colchicine for a day or two should not be enough to cause any problem, if he takes the colchicine for several days in a row, he should probably skip the rosuvastatin during that time.   If he takes the colchicine for a long period of time (like several weeks) then we would just need to reduce the dose of the rosuvastatin.

## 2019-05-09 DIAGNOSIS — J301 Allergic rhinitis due to pollen: Secondary | ICD-10-CM | POA: Diagnosis not present

## 2019-05-09 DIAGNOSIS — H6983 Other specified disorders of Eustachian tube, bilateral: Secondary | ICD-10-CM | POA: Diagnosis not present

## 2019-05-09 DIAGNOSIS — H9313 Tinnitus, bilateral: Secondary | ICD-10-CM | POA: Diagnosis not present

## 2019-05-10 DIAGNOSIS — Z20828 Contact with and (suspected) exposure to other viral communicable diseases: Secondary | ICD-10-CM | POA: Diagnosis not present

## 2019-05-23 ENCOUNTER — Other Ambulatory Visit: Payer: Self-pay

## 2019-05-26 ENCOUNTER — Encounter: Payer: Self-pay | Admitting: Gastroenterology

## 2019-05-26 ENCOUNTER — Other Ambulatory Visit: Payer: Self-pay

## 2019-05-26 ENCOUNTER — Ambulatory Visit: Payer: BC Managed Care – PPO | Admitting: Gastroenterology

## 2019-05-26 VITALS — BP 122/79 | HR 79 | Temp 98.2°F | Ht 71.0 in | Wt 247.2 lb

## 2019-05-26 DIAGNOSIS — K2100 Gastro-esophageal reflux disease with esophagitis, without bleeding: Secondary | ICD-10-CM

## 2019-05-26 NOTE — Progress Notes (Signed)
Cephas Darby, MD [address]  [place]  [place], [place] [address]  Main: [phone]  Fax: [phone]    Gastroenterology Consultation  Referring Provider:     Birdie Sons, MD Primary Care Physician:  Birdie Sons, MD Primary Gastroenterologist:  Dr. Cephas Darby Reason for Consultation:  GERD, dysphagia        HPI:   Gilbert Lee is a 54 y.o. male referred by Dr. Birdie Sons, MD  for consultation & management of chronic GERD and dysphagia.  Patient reports symptoms of acid reflux and intermittent dysphagia for several months.  He gained weight within last 1 year due to pandemic and decreased physical activity. Patient originally presented in 10/2018 secondary to food impaction after eating steak.  He underwent urgent endoscopy.  Thereafter, I started him on omeprazole 40 mg daily.  He underwent repeat endoscopy which showed improvement in esophagitis, edema presence of mild stenosis in the lower esophagus which was traversed with mild resistance, that has resulted in mucosal disruption.  Balloon dilation was not performed at that time. There was no evidence of eosinophilic esophagitis on biopsies other than mild eosinophilia.  I started him on omeprazole 40 mg 2 times a day, later he developed gout flare and therefore omeprazole was stopped.  His gout flare has resolved.  He then started taking vitamin supplements, that has resulted in another flare.  He is not sure if omeprazole has really caused a gout flare.  He is on Pepcid.  He has been following antireflux lifestyle taking Pepcid as needed.  He reports feeling better with regards to his reflux symptoms.  He has not attempted to eat hard meats since then.  He denies difficulty swallowing.  He cut back on coffee significantly.  He drinks a glass of wine intermittently.  He does not smoke tobacco  NSAIDs: None  Antiplts/Anticoagulants/Anti thrombotics: None  GI Procedures:  Upper endoscopy 11/14/2018   - Food in the middle third of the esophagus and in the lower third of the esophagus. Removal was successful. - Acute esophagitis. - Normal stomach. - Normal duodenal bulb and second portion of the duodenum.  Upper endoscopy 04/09/2019 - Normal duodenal bulb and second portion of the duodenum. - Small hiatal hernia. - Normal stomach. - LA Grade A reflux esophagitis with no bleeding. - Benign-appearing esophageal stenosis. - Normal esophagus. Biopsied.  DIAGNOSIS:  A. ESOPHAGUS, DISTAL; COLD BIOPSY:  - BENIGN SQUAMOUS MUCOSA WITH ACANTHOSIS, SPONGIOSIS, BASAL HYPERPLASIA,  AND MIXED INFLAMMATION, INCLUDING MILD EOSINOPHILIA (FOCALLY UP TO 10  EOSINOPHILS PER HPF).  - NEGATIVE FOR DYSPLASIA AND MALIGNANCY.   B. ESOPHAGUS, PROXIMAL; COLD BIOPSY:  - BENIGN SQUAMOUS MUCOSA WITH ACANTHOSIS, SPONGIOSIS, BASAL HYPERPLASIA,  AND MIXED INFLAMMATION, INCLUDING MILD EOSINOPHILIA (FOCALLY UP TO 12  EOSINOPHILS PER HPF).  - NEGATIVE FOR DYSPLASIA AND MALIGNANCY.   Comment:  The histologic features would favor reflux esophagitis. Of note, mild to  moderate eosinophilia may be present in reflux esophagitis.  Colonoscopy 11/11/2015  - Two 2 to 3 mm polyps in the transverse colon, removed with a cold biopsy forceps. Resected and retrieved. - Two 2 to 3 mm polyps in the sigmoid colon, removed with a cold biopsy forceps. Resected and retrieved. - Non-bleeding internal hemorrhoids.    Past Medical History:  Diagnosis Date  . Asthma   . Gout     Past Surgical History:  Procedure Laterality Date  . COLONOSCOPY WITH PROPOFOL N/A 11/11/2015   Procedure: COLONOSCOPY WITH  PROPOFOL;  Surgeon: Lucilla Lame, MD;  Location: [facility];  Service: Endoscopy;  Laterality: N/A;  . ESOPHAGOGASTRODUODENOSCOPY (EGD) WITH PROPOFOL N/A 11/14/2018   Procedure: ESOPHAGOGASTRODUODENOSCOPY (EGD) WITH PROPOFOL;  Surgeon: Lin Landsman, MD;  Location: [facility] ENDOSCOPY;  Service: Gastroenterology;   Laterality: N/A;  . ESOPHAGOGASTRODUODENOSCOPY (EGD) WITH PROPOFOL N/A 04/09/2019   Procedure: ESOPHAGOGASTRODUODENOSCOPY (EGD) WITH PROPOFOL;  Surgeon: Lin Landsman, MD;  Location: [facility] ENDOSCOPY;  Service: Gastroenterology;  Laterality: N/A;  . KNEE ARTHROSCOPY Right   . POLYPECTOMY N/A 11/11/2015   Procedure: POLYPECTOMY;  Surgeon: Lucilla Lame, MD;  Location: [facility];  Service: Endoscopy;  Laterality: N/A;    Current Outpatient Medications:  .  albuterol (PROVENTIL HFA;VENTOLIN HFA) 108 (90 Base) MCG/ACT inhaler, Inhale 1-2 puffs into the lungs every 6 (six) hours as needed for shortness of breath., Disp: 1 Inhaler, Rfl: 2 .  allopurinol (ZYLOPRIM) 100 MG tablet, Take 100 mg by mouth daily. Take with 300mg  tablet for 400mg  dose, Disp: , Rfl:  .  allopurinol (ZYLOPRIM) 300 MG tablet, TAKE 1 TABLET (300 MG TOTAL) BY MOUTH DAILY. TAKE WITH 100MG  TAB FOR 400MG  TOTAL A DAY, Disp: 30 tablet, Rfl: 4 .  cholecalciferol (VITAMIN D) 25 MCG (1000 UT) tablet, Take 1,000 Units by mouth daily., Disp: , Rfl:  .  colchicine 0.6 MG tablet, Take 2 tablets (1.2mg ) by mouth at first sign of gout flare followed by 1 tablet (0.6mg ) after 1 hour. (Max 1.8mg  within 1 hour), Disp: , Rfl:  .  magnesium oxide (MAG-OX) 400 MG tablet, Take 800 mg by mouth daily., Disp: , Rfl:  .  rosuvastatin (CRESTOR) 20 MG tablet, TAKE 1 TABLET BY MOUTH EVERY DAY, Disp: 30 tablet, Rfl: 3 .  zinc gluconate 50 MG tablet, Take 50 mg by mouth daily., Disp: , Rfl:  .  omeprazole (PRILOSEC) 40 MG capsule, Take 1 capsule (40 mg total) by mouth daily before breakfast., Disp: 30 capsule, Rfl: 2   Family History  Problem Relation Age of Onset  . Other Father        intestinal obstruction  . Prostate cancer Father   . Parkinsonism Mother   . Deep vein thrombosis Brother   . Deep vein thrombosis Brother      Social History   Tobacco Use  . Smoking status: Never Smoker  . Smokeless tobacco: Never Used  Substance Use  Topics  . Alcohol use: Yes    Alcohol/week: 0.0 standard drinks  . Drug use: No    Allergies as of 05/26/2019  . (No Known Allergies)    Review of Systems:    All systems reviewed and negative except where noted in HPI.   Physical Exam:  BP 122/79 (BP Location: Left Arm, Patient Position: Sitting, Cuff Size: Large)   Pulse 79   Temp 98.2 F (36.8 C) (Oral)   Ht 5\' 11"  (1.803 m)   Wt 247 lb 4 oz (112.2 kg)   BMI 34.48 kg/m  No LMP for male patient.  General:   Alert,  Well-developed, well-nourished, pleasant and cooperative in NAD Head:  Normocephalic and atraumatic. Eyes:  Sclera clear, no icterus.   Conjunctiva pink. Ears:  Normal auditory acuity. Nose:  No deformity, discharge, or lesions. Mouth:  No deformity or lesions,oropharynx pink & moist. Neck:  Supple; no masses or thyromegaly. Lungs:  Respirations even and unlabored.  Clear throughout to auscultation.   No wheezes, crackles, or rhonchi. No acute distress. Heart:  Regular rate and rhythm; no murmurs,  clicks, rubs, or gallops. Abdomen:  Normal bowel sounds. Soft, obese, non-tender and non-distended without masses, hepatosplenomegaly or hernias noted.  No guarding or rebound tenderness.   Rectal: Not performed Msk:  Symmetrical without gross deformities. Good, equal movement & strength bilaterally. Pulses:  Normal pulses noted. Extremities:  No clubbing or edema.  No cyanosis. Neurologic:  Alert and oriented x3;  grossly normal neurologically. Skin:  Intact without significant lesions or rashes. No jaundice. Psych:  Alert and cooperative. Normal mood and affect.  Imaging Studies: Reviewed  Assessment and Plan:   HERBY BIGA is a 55 y.o. male with BMI of 34.5, gout, chronic GERD is seen in consultation for chronic GERD and dysphagia.  EGD revealed erosive esophagitis and benign esophageal stricture.  Currently symptoms are under control with lifestyle modification, antireflux lifestyle.  Patient is  comfortable to restart low-dose omeprazole 20 mg 2 times a day Continue antireflux lifestyle  Reiterated on healthy eating habits, exercise to lose weight, patient is motivated Repeat EGD in 3 months   Follow up in 3 months   Cephas Darby, MD

## 2019-06-05 ENCOUNTER — Ambulatory Visit: Payer: BC Managed Care – PPO | Attending: Internal Medicine

## 2019-06-05 DIAGNOSIS — Z23 Encounter for immunization: Secondary | ICD-10-CM

## 2019-06-05 NOTE — Progress Notes (Signed)
   Covid-19 Vaccination Clinic  Name:  NYSAIAH CARRAHER    MRN: [id number] DOB: 02/05/1966  06/05/2019  Mr. Seeton was observed post Covid-19 immunization for 15 minutes without incident. He was provided with Vaccine Information Sheet and instruction to access the V-Safe system.   Mr. Ursino was instructed to call 911 with any severe reactions post vaccine: Marland Kitchen Difficulty breathing  . Swelling of face and throat  . A fast heartbeat  . A bad rash all over body  . Dizziness and weakness   Immunizations Administered    Name Date Dose VIS Date Route   Pfizer COVID-19 Vaccine 06/05/2019  9:51 AM 0.3 mL 02/28/2019 Intramuscular   Manufacturer: [place]   Lot: [id number]   [place]: [id number]

## 2019-06-08 DIAGNOSIS — Z20828 Contact with and (suspected) exposure to other viral communicable diseases: Secondary | ICD-10-CM | POA: Diagnosis not present

## 2019-06-14 ENCOUNTER — Ambulatory Visit: Payer: BC Managed Care – PPO

## 2019-07-02 ENCOUNTER — Ambulatory Visit: Payer: BC Managed Care – PPO | Attending: Internal Medicine

## 2019-07-02 DIAGNOSIS — Z23 Encounter for immunization: Secondary | ICD-10-CM

## 2019-07-02 NOTE — Progress Notes (Signed)
   Covid-19 Vaccination Clinic  Name:  Gilbert Lee    MRN: [id number] DOB: 13-Mar-1966  07/02/2019  [name] was observed post Covid-19 immunization for 15 minutes without incident. He was provided with Vaccine Information Sheet and instruction to access the V-Safe system.   [name] was instructed to call 911 with any severe reactions post vaccine: Marland Kitchen Difficulty breathing  . Swelling of face and throat  . A fast heartbeat  . A bad rash all over body  . Dizziness and weakness   Immunizations Administered    Name Date Dose VIS Date Route   Pfizer COVID-19 Vaccine 07/02/2019  1:59 PM 0.3 mL 02/28/2019 Intramuscular   Manufacturer: [employer]   Lot: [id number]   [place]: [id number]

## 2019-07-08 ENCOUNTER — Other Ambulatory Visit: Payer: Self-pay | Admitting: Family Medicine

## 2019-07-08 NOTE — Telephone Encounter (Signed)
LOV 12/12/17  Last lipids 12/12/17

## 2019-07-08 NOTE — Telephone Encounter (Signed)
Requested medications are due for refill today?  Yes   Requested medications are on active medication list?  Yes  Last Refill:  03/01/2019  # 30 with 3 refills   Future visit scheduled? No  Notes to Clinic:  Medication failed RX refill protocol due to no office visit in past year and lab work not within 360 days.  Last labs were performed on 12/12/2017.

## 2019-07-09 ENCOUNTER — Other Ambulatory Visit: Payer: Self-pay | Admitting: Family Medicine

## 2019-07-09 MED ORDER — ROSUVASTATIN CALCIUM 20 MG PO TABS: 20.0000 mg | ORAL_TABLET | Freq: Every day | ORAL | 0 refills | Status: DC

## 2019-07-09 NOTE — Telephone Encounter (Signed)
Medication Refill - Medication: rosuvastatin (CRESTOR) 20 MG tablet   Has the patient contacted their pharmacy? Yes.   (Agent: If no, request that the patient contact the pharmacy for the refill.) (Agent: If yes, when and what did the pharmacy advise?)  Preferred Pharmacy (with phone number or street name): [id number] [place] [address] Phone:  [phone]  Fax:  [phone]       Agent: Please be advised that RX refills may take up to 3 business days. We ask that you follow-up with your pharmacy.

## 2019-07-09 NOTE — Telephone Encounter (Signed)
LOV  12/12/17  LRF  03/01/19  30 x 3  Last Lipid  12/12/17

## 2019-07-09 NOTE — Telephone Encounter (Signed)
Requested medication (s) are due for refill today: yes  Requested medication (s) are on the active medication list: yes  Last refill:  03/01/2019  Future visit scheduled: yes  Notes to clinic:  Patient has a upcoming appointment in May Please review for short supply   Requested Prescriptions  Pending Prescriptions Disp Refills   rosuvastatin (CRESTOR) 20 MG tablet 30 tablet 3    Sig: Take 1 tablet (20 mg total) by mouth daily.      Cardiovascular:  Antilipid - Statins Failed - 07/09/2019 12:30 PM      Failed - Total Cholesterol in normal range and within 360 days    Cholesterol, Total  Date Value Ref Range Status  12/12/2017 169 100 - 199 mg/dL Final          Failed - LDL in normal range and within 360 days    LDL Cholesterol (Calc)  Date Value Ref Range Status  01/16/2017 162 (H) mg/dL (calc) Final    Comment:    Reference range: <100 . Desirable range <100 mg/dL for primary prevention;   <70 mg/dL for patients with CHD or diabetic patients  with > or = 2 CHD risk factors. Mar[place] Kitchen LDL-C is now calculated using the Martin-Hopkins  calculation, which is a validated novel method providing  better accuracy than the Friedewald equation in the  estimation of LDL-C.  Cresenciano Genre et al. Annamaria Helling. MU:[id number]): 2061-2068  (http://education.[facility].com/faq/FAQ164)    LDL Calculated  Date Value Ref Range Status  12/12/2017 94 0 - 99 mg/dL Final          Failed - HDL in normal range and within 360 days    HDL  Date Value Ref Range Status  12/12/2017 42 >39 mg/dL Final          Failed - Triglycerides in normal range and within 360 days    Triglycerides  Date Value Ref Range Status  12/12/2017 167 (H) 0 - 149 mg/dL Final          Failed - Valid encounter within last 12 months    Recent Outpatient Visits           1 year ago Hyperlipidemia, unspecified hyperlipidemia type   [facility] Birdie Sons, MD   1 year ago Annual physical exam   [facility] Birdie Sons, MD   2 years ago Hyperuricemia   [facility] Birdie Sons, MD   3 years ago Right foot pain   [facility] Birdie Sons, MD   3 years ago Annual physical exam   [facility] Birdie Sons, MD       Future Appointments             In 2 weeks Fisher, Kirstie Peri, MD [facility] [place]   In 2 months Vanga, Tally Due, MD [place] - Patient is not pregnant

## 2019-07-25 NOTE — Progress Notes (Signed)
Established patient visit   Patient: Gilbert Lee   DOB: 1965/10/09   54 y.o. Male  MRN: [id number] Visit Date: 07/28/2019  Today's healthcare provider: Lelon Huh, MD   Chief Complaint  Patient presents with  . Hyperlipidemia   Subjective    HPI Lipid/Cholesterol, Follow-up  Last lipid panel Other pertinent labs  Lab Results  Component Value Date   CHOL 169 12/12/2017   HDL 42 12/12/2017   LDLCALC 94 12/12/2017   TRIG 167 (H) 12/12/2017   CHOLHDL 4.0 12/12/2017   Lab Results  Component Value Date   ALT 29 11/13/2018   AST 22 11/13/2018   PLT 175 11/13/2018   TSH 1.610 10/11/2015     He was last seen for this 12/12/2017. Management since that visit includes continuing same medication.  He reports good compliance with treatment. He is not having side effects.  Symptoms: No chest pain No chest pressure/discomfort No dyspnea No lower extremity edema No numbness or tingling of extremity No orthopnea No palpitations No paroxysmal nocturnal dyspnea No speech difficulty No syncope  Current diet: well balanced Current exercise: none  Wt Readings from Last 3 Encounters:  07/28/19 257 lb (116.6 kg)  05/26/19 247 lb 4 oz (112.2 kg)  04/09/19 245 lb (111.1 kg)   The 10-year ASCVD risk score Mikey Bussing DC Jr., et al., 2013) is: 5.9%  ----------------------------------------------------------------------------------------- Having more trouble sleeping at night. Taken Melatonin. Has been under a lot of stress related to work. Has not been eating healthy or exercising consistently, but when he does exercise he sleeps better. Has more trouble focusing during the day.       Medications: Outpatient Medications Prior to Visit  Medication Sig  . allopurinol (ZYLOPRIM) 100 MG tablet Take 100 mg by mouth daily. Take with 300mg  tablet for 400mg  dose  . allopurinol (ZYLOPRIM) 300 MG tablet TAKE 1 TABLET (300 MG TOTAL) BY MOUTH DAILY. TAKE WITH 100MG  TAB FOR 400MG   TOTAL A DAY  . cholecalciferol (VITAMIN D) 25 MCG (1000 UT) tablet Take 1,000 Units by mouth daily.  . magnesium oxide (MAG-OX) 400 MG tablet Take 800 mg by mouth daily.  Marland Kitchen MELATONIN PO Take by mouth at bedtime.  . rosuvastatin (CRESTOR) 20 MG tablet Take 1 tablet (20 mg total) by mouth daily.  Marland Kitchen zinc gluconate 50 MG tablet Take 50 mg by mouth daily.  . [DISCONTINUED] albuterol (PROVENTIL HFA;VENTOLIN HFA) 108 (90 Base) MCG/ACT inhaler Inhale 1-2 puffs into the lungs every 6 (six) hours as needed for shortness of breath.  Marland Kitchen omeprazole (PRILOSEC) 40 MG capsule Take 1 capsule (40 mg total) by mouth daily before breakfast.  . [DISCONTINUED] colchicine 0.6 MG tablet Take 2 tablets (1.2mg ) by mouth at first sign of gout flare followed by 1 tablet (0.6mg ) after 1 hour. (Max 1.8mg  within 1 hour)   No facility-administered medications prior to visit.   Rarely requires inhaler, last was a few months ago.   Review of Systems  Constitutional: Negative for appetite change, chills and fever.  Respiratory: Negative for chest tightness, shortness of breath and wheezing.   Cardiovascular: Negative for chest pain and palpitations.  Gastrointestinal: Negative for abdominal pain, nausea and vomiting.      Objective    BP 135/83 (BP Location: Right Arm, Cuff Size: Large)   Pulse 75   Temp (!) 97.1 F (36.2 C) (Temporal)   Resp 16   Wt 257 lb (116.6 kg)   SpO2 98% Comment: room air  BMI  35.84 kg/m     Physical Exam   General: Appearance:    Obese male in no acute distress  Eyes:    PERRL, conjunctiva/corneas clear, EOM's intact       Lungs:     Clear to auscultation bilaterally, respirations unlabored  Heart:    Normal heart rate. Normal rhythm. No murmurs, rubs, or gallops.   MS:   All extremities are intact.   Neurologic:   Awake, alert, oriented x 3. No apparent focal neurological           defect.         No results found for any visits on 07/28/19.  Assessment & Plan     1.  Hyperlipidemia, unspecified hyperlipidemia type He is tolerating rosuvastatin well with no adverse effects.   - Lipid panel - Comprehensive metabolic panel - CBC - CK (Creatine Kinase)  2. Prostate cancer screening  - PSA Total (Reflex To Free) ([facility] only)  3. Hyperuricemia Gout is very well controlled. Has not been to see rheumatology for quite awhile. He would rather this be managed here as long as it well controlled.  - Uric acid  4. Primary insomnia Likely secondary to stress and contributing to trouble concentrating during the day. Encouraged to start exercising consistently and reduce alcohol intake. Can continue OTC melatonin.   5. History of asthma.  Rarely requires albuterol, but current inhaler has expired. - albuterol (VENTOLIN HFA) 108 (90 Base) MCG/ACT inhaler; Inhale 1-2 puffs into the lungs every 6 (six) hours as needed for shortness of breath.  Dispense: 18 g; Refill: 1   No follow-ups on file.      The entirety of the information documented in the History of Present Illness, Review of Systems and Physical Exam were personally obtained by me. Portions of this information were initially documented by the CMA and reviewed by me for thoroughness and accuracy.      Lelon Huh, MD  [facility] [phone] (phone) [phone] (fax)  [place]

## 2019-07-28 ENCOUNTER — Other Ambulatory Visit: Payer: Self-pay

## 2019-07-28 ENCOUNTER — Ambulatory Visit: Payer: BC Managed Care – PPO | Admitting: Family Medicine

## 2019-07-28 ENCOUNTER — Encounter: Payer: Self-pay | Admitting: Family Medicine

## 2019-07-28 VITALS — BP 135/83 | HR 75 | Temp 97.1°F | Resp 16 | Wt 257.0 lb

## 2019-07-28 DIAGNOSIS — E785 Hyperlipidemia, unspecified: Secondary | ICD-10-CM

## 2019-07-28 DIAGNOSIS — Z125 Encounter for screening for malignant neoplasm of prostate: Secondary | ICD-10-CM

## 2019-07-28 DIAGNOSIS — E790 Hyperuricemia without signs of inflammatory arthritis and tophaceous disease: Secondary | ICD-10-CM | POA: Diagnosis not present

## 2019-07-28 DIAGNOSIS — F5101 Primary insomnia: Secondary | ICD-10-CM | POA: Diagnosis not present

## 2019-07-28 DIAGNOSIS — Z8709 Personal history of other diseases of the respiratory system: Secondary | ICD-10-CM

## 2019-07-28 MED ORDER — ALBUTEROL SULFATE HFA 108 (90 BASE) MCG/ACT IN AERS: 1.0000 | INHALATION_SPRAY | Freq: Four times a day (QID) | RESPIRATORY_TRACT | 1 refills | Status: DC | PRN

## 2019-07-28 NOTE — Patient Instructions (Addendum)
. Please review the attached list of medications and notify my office if there are any errors.   . Please go to the lab draw station in [address] on the second floor of [facility]  when you are fasting for 8 hours. Normal hours are 8:00am to 12:30pm and 1:30pm to 4:00pm Monday through Friday   Insomnia Insomnia is a sleep disorder that makes it difficult to fall asleep or stay asleep. Insomnia can cause fatigue, low energy, difficulty concentrating, mood swings, and poor performance at work or school. There are three different ways to classify insomnia:  Difficulty falling asleep.  Difficulty staying asleep.  Waking up too early in the morning. Any type of insomnia can be long-term (chronic) or short-term (acute). Both are common. Short-term insomnia usually lasts for three months or less. Chronic insomnia occurs at least three times a week for longer than three months. What are the causes? Insomnia may be caused by another condition, situation, or substance, such as:  Anxiety.  Certain medicines.  Gastroesophageal reflux disease (GERD) or other gastrointestinal conditions.  Asthma or other breathing conditions.  Restless legs syndrome, sleep apnea, or other sleep disorders.  Chronic pain.  Menopause.  Stroke.  Abuse of alcohol, tobacco, or illegal drugs.  Mental health conditions, such as depression.  Caffeine.  Neurological disorders, such as Alzheimer's disease.  An overactive thyroid (hyperthyroidism). Sometimes, the cause of insomnia may not be known. What increases the risk? Risk factors for insomnia include:  Gender. Women are affected more often than men.  Age. Insomnia is more common as you get older.  Stress.  Lack of exercise.  Irregular work schedule or working night shifts.  Traveling between different time zones.  Certain medical and mental health conditions. What are the signs or symptoms? If you have insomnia, the main  symptom is having trouble falling asleep or having trouble staying asleep. This may lead to other symptoms, such as:  Feeling fatigued or having low energy.  Feeling nervous about going to sleep.  Not feeling rested in the morning.  Having trouble concentrating.  Feeling irritable, anxious, or depressed. How is this diagnosed? This condition may be diagnosed based on:  Your symptoms and medical history. Your health care provider may ask about: ? Your sleep habits. ? Any medical conditions you have. ? Your mental health.  A physical exam. How is this treated? Treatment for insomnia depends on the cause. Treatment may focus on treating an underlying condition that is causing insomnia. Treatment may also include:  Medicines to help you sleep.  Counseling or therapy.  Lifestyle adjustments to help you sleep better. Follow these instructions at home: Eating and drinking   Limit or avoid alcohol, caffeinated beverages, and cigarettes, especially close to bedtime. These can disrupt your sleep.  Do not eat a large meal or eat spicy foods right before bedtime. This can lead to digestive discomfort that can make it hard for you to sleep. Sleep habits   Keep a sleep diary to help you and your health care provider figure out what could be causing your insomnia. Write down: ? When you sleep. ? When you wake up during the night. ? How well you sleep. ? How rested you feel the next day. ? Any side effects of medicines you are taking. ? What you eat and drink.  Make your bedroom a dark, comfortable place where it is easy to fall asleep. ? Put up shades or blackout curtains to block light from outside. ?  Use a white noise machine to block noise. ? Keep the temperature cool.  Limit screen use before bedtime. This includes: ? Watching TV. ? Using your smartphone, tablet, or computer.  Stick to a routine that includes going to bed and waking up at the same times every day and  night. This can help you fall asleep faster. Consider making a quiet activity, such as reading, part of your nighttime routine.  Try to avoid taking naps during the day so that you sleep better at night.  Get out of bed if you are still awake after 15 minutes of trying to sleep. Keep the lights down, but try reading or doing a quiet activity. When you feel sleepy, go back to bed. General instructions  Take over-the-counter and prescription medicines only as told by your health care provider.  Exercise regularly, as told by your health care provider. Avoid exercise starting several hours before bedtime.  Use relaxation techniques to manage stress. Ask your health care provider to suggest some techniques that may work well for you. These may include: ? Breathing exercises. ? Routines to release muscle tension. ? Visualizing peaceful scenes.  Make sure that you drive carefully. Avoid driving if you feel very sleepy.  Keep all follow-up visits as told by your health care provider. This is important. Contact a health care provider if:  You are tired throughout the day.  You have trouble in your daily routine due to sleepiness.  You continue to have sleep problems, or your sleep problems get worse. Get help right away if:  You have serious thoughts about hurting yourself or someone else. If you ever feel like you may hurt yourself or others, or have thoughts about taking your own life, get help right away. You can go to your nearest emergency department or call:  Your local emergency services (911 in the [place]  A suicide crisis helpline, such as the [place] at (770) 698-7629. This is open 24 hours a day. Summary  Insomnia is a sleep disorder that makes it difficult to fall asleep or stay asleep.  Insomnia can be long-term (chronic) or short-term (acute).  Treatment for insomnia depends on the cause. Treatment may focus on treating an underlying  condition that is causing insomnia.  Keep a sleep diary to help you and your health care provider figure out what could be causing your insomnia. This information is not intended to replace advice given to you by your health care provider. Make sure you discuss any questions you have with your health care provider. Document Revised: 02/16/2017 Document Reviewed: 12/14/2016 Elsevier Patient Education  2020 [employer].

## 2019-07-29 DIAGNOSIS — Z125 Encounter for screening for malignant neoplasm of prostate: Secondary | ICD-10-CM | POA: Diagnosis not present

## 2019-07-29 DIAGNOSIS — E790 Hyperuricemia without signs of inflammatory arthritis and tophaceous disease: Secondary | ICD-10-CM | POA: Diagnosis not present

## 2019-07-29 DIAGNOSIS — E785 Hyperlipidemia, unspecified: Secondary | ICD-10-CM | POA: Diagnosis not present

## 2019-07-30 LAB — COMPREHENSIVE METABOLIC PANEL
ALT: 29 IU/L (ref 0–44)
AST: 27 IU/L (ref 0–40)
Albumin/Globulin Ratio: 1.8 (ref 1.2–2.2)
Albumin: 4 g/dL (ref 3.8–4.9)
Alkaline Phosphatase: 48 IU/L (ref 39–117)
BUN/Creatinine Ratio: 15 (ref 9–20)
BUN: 17 mg/dL (ref 6–24)
Bilirubin Total: 0.3 mg/dL (ref 0.0–1.2)
CO2: 23 mmol/L (ref 20–29)
Calcium: 8.8 mg/dL (ref 8.7–10.2)
Chloride: 106 mmol/L (ref 96–106)
Creatinine, Ser: 1.15 mg/dL (ref 0.76–1.27)
GFR calc Af Amer: 82 mL/min/{1.73_m2} (ref >59)
GFR calc non Af Amer: 71 mL/min/{1.73_m2} (ref >59)
Globulin, Total: 2.2 g/dL (ref 1.5–4.5)
Glucose: 104 mg/dL — ABNORMAL HIGH (ref 65–99)
Potassium: 4.3 mmol/L (ref 3.5–5.2)
Sodium: 142 mmol/L (ref 134–144)
Total Protein: 6.2 g/dL (ref 6.0–8.5)

## 2019-07-30 LAB — LIPID PANEL
Chol/HDL Ratio: 3.7 ratio (ref 0.0–5.0)
Cholesterol, Total: 154 mg/dL (ref 100–199)
HDL: 42 mg/dL (ref >39)
LDL Chol Calc (NIH): 83 mg/dL (ref 0–99)
Triglycerides: 167 mg/dL — ABNORMAL HIGH (ref 0–149)
VLDL Cholesterol Cal: 29 mg/dL (ref 5–40)

## 2019-07-30 LAB — CBC
Hematocrit: 43.2 % (ref 37.5–51.0)
Hemoglobin: 14.4 g/dL (ref 13.0–17.7)
MCH: 30.3 pg (ref 26.6–33.0)
MCHC: 33.3 g/dL (ref 31.5–35.7)
MCV: 91 fL (ref 79–97)
Platelets: 189 10*3/uL (ref 150–450)
RBC: 4.76 x10E6/uL (ref 4.14–5.80)
RDW: 12.8 % (ref 11.6–15.4)
WBC: 6.5 10*3/uL (ref 3.4–10.8)

## 2019-07-30 LAB — URIC ACID: Uric Acid: 4.7 mg/dL (ref 3.8–8.4)

## 2019-07-30 LAB — PSA TOTAL (REFLEX TO FREE): Prostate Specific Ag, Serum: 0.7 ng/mL (ref 0.0–4.0)

## 2019-07-30 LAB — CK: Total CK: 198 U/L (ref 41–331)

## 2019-08-02 ENCOUNTER — Other Ambulatory Visit: Payer: Self-pay | Admitting: Family Medicine

## 2019-08-02 NOTE — Telephone Encounter (Signed)
Requested Prescriptions  Pending Prescriptions Disp Refills  . rosuvastatin (CRESTOR) 20 MG tablet [Pharmacy Med Name: ROSUVASTATIN CALCIUM 20 MG TAB] 90 tablet 3    Sig: TAKE 1 TABLET BY MOUTH EVERY DAY     Cardiovascular:  Antilipid - Statins Failed - 08/02/2019  1:35 PM      Failed - LDL in normal range and within 360 days    LDL Cholesterol (Calc)  Date Value Ref Range Status  01/16/2017 162 (H) mg/dL (calc) Final    Comment:    Reference range: <100 . Desirable range <100 mg/dL for primary prevention;   <70 mg/dL for patients with CHD or diabetic patients  with > or = 2 CHD risk factors. Marland Kitchen LDL-C is now calculated using the Martin-Hopkins  calculation, which is a validated novel method providing  better accuracy than the Friedewald equation in the  estimation of LDL-C.  Cresenciano Genre et al. Annamaria Helling. [id number]): 2061-2068  (http://education.[employer].com/faq/FAQ164)    LDL Chol Calc (NIH)  Date Value Ref Range Status  07/29/2019 83 0 - 99 mg/dL Final         Failed - Triglycerides in normal range and within 360 days    Triglycerides  Date Value Ref Range Status  07/29/2019 167 (H) 0 - 149 mg/dL Final         Passed - Total Cholesterol in normal range and within 360 days    Cholesterol, Total  Date Value Ref Range Status  07/29/2019 154 100 - 199 mg/dL Final         Passed - HDL in normal range and within 360 days    HDL  Date Value Ref Range Status  07/29/2019 42 >39 mg/dL Final         Passed - Patient is not pregnant      Passed - Valid encounter within last 12 months    Recent Outpatient Visits          5 days ago Hyperlipidemia, unspecified hyperlipidemia type   [facility] Birdie Sons, MD   1 year ago Hyperlipidemia, unspecified hyperlipidemia type   [facility] Birdie Sons, MD   1 year ago Annual physical exam   [facility] Birdie Sons, MD   2 years ago Hyperuricemia   [facility] Birdie Sons, MD   3 years ago Right foot pain   [facility] Birdie Sons, MD      Future Appointments            In 1 month Vanga, Tally Due, MD [place]

## 2019-08-06 ENCOUNTER — Other Ambulatory Visit: Payer: Self-pay | Admitting: Family Medicine

## 2019-08-06 NOTE — Telephone Encounter (Signed)
Requested Prescriptions  Pending Prescriptions Disp Refills  . allopurinol (ZYLOPRIM) 300 MG tablet [Pharmacy Med Name: ALLOPURINOL 300 MG TABLET] 90 tablet 3    Sig: TAKE 1 TABLET (300 MG TOTAL) BY MOUTH DAILY. TAKE WITH 100MG  TAB FOR 400MG  TOTAL A DAY     Endocrinology:  Gout Agents Passed - 08/06/2019  1:19 AM      Passed - Uric Acid in normal range and within 360 days    Uric Acid  Date Value Ref Range Status  07/29/2019 4.7 3.8 - 8.4 mg/dL Final    Comment:               Therapeutic target for gout patients: <6.0         Passed - Cr in normal range and within 360 days    Creatinine, Ser  Date Value Ref Range Status  07/29/2019 1.15 0.76 - 1.27 mg/dL Final         Passed - Valid encounter within last 12 months    Recent Outpatient Visits          1 week ago Hyperlipidemia, unspecified hyperlipidemia type   [facility] Birdie Sons, MD   1 year ago Hyperlipidemia, unspecified hyperlipidemia type   [facility] Birdie Sons, MD   1 year ago Annual physical exam   [facility] Birdie Sons, MD   2 years ago Hyperuricemia   [facility] Birdie Sons, MD   3 years ago Right foot pain   [facility] Birdie Sons, MD      Future Appointments            In 1 month Vanga, Tally Due, MD [place]

## 2019-09-01 ENCOUNTER — Ambulatory Visit: Payer: BC Managed Care – PPO | Admitting: Gastroenterology

## 2019-09-08 ENCOUNTER — Ambulatory Visit: Payer: BC Managed Care – PPO | Admitting: Gastroenterology

## 2019-09-08 ENCOUNTER — Other Ambulatory Visit: Payer: Self-pay

## 2019-11-02 DIAGNOSIS — S92352A Displaced fracture of fifth metatarsal bone, left foot, initial encounter for closed fracture: Secondary | ICD-10-CM | POA: Diagnosis not present

## 2019-11-02 DIAGNOSIS — M7989 Other specified soft tissue disorders: Secondary | ICD-10-CM | POA: Diagnosis not present

## 2019-11-02 DIAGNOSIS — S99922A Unspecified injury of left foot, initial encounter: Secondary | ICD-10-CM | POA: Diagnosis not present

## 2019-11-03 DIAGNOSIS — S92352A Displaced fracture of fifth metatarsal bone, left foot, initial encounter for closed fracture: Secondary | ICD-10-CM | POA: Diagnosis not present

## 2019-11-04 ENCOUNTER — Other Ambulatory Visit: Payer: Self-pay | Admitting: Podiatry

## 2019-11-05 ENCOUNTER — Encounter
Admission: RE | Admit: 2019-11-05 | Discharge: 2019-11-05 | Disposition: A | Discharge status: Home / Self Care | Payer: BC Managed Care – PPO | Source: Ambulatory Visit | Attending: Podiatry | Admitting: Podiatry

## 2019-11-05 ENCOUNTER — Other Ambulatory Visit: Payer: Self-pay

## 2019-11-05 DIAGNOSIS — Z01812 Encounter for preprocedural laboratory examination: Secondary | ICD-10-CM | POA: Diagnosis not present

## 2019-11-05 HISTORY — DX: Gastro-esophageal reflux disease without esophagitis: K21.9

## 2019-11-05 HISTORY — DX: Unspecified osteoarthritis, unspecified site: M19.90

## 2019-11-05 HISTORY — DX: Personal history of other diseases of the digestive system: Z87.19

## 2019-11-05 NOTE — Patient Instructions (Addendum)
Your procedure is scheduled on: 11-07-19 FRIDAY Report to Same Day Surgery 2nd floor medical mall [facility] Entrance-take elevator on left to 2nd floor.  Check in with surgery information desk.) To find out your arrival time please call [phone] between 1PM - 3PM on 11-06-19 THURSDAY  Remember: Instructions that are not followed completely may result in serious medical risk, up to and including death, or upon the discretion of your surgeon and anesthesiologist your surgery may need to be rescheduled.    _x___ 1. Do not eat food after midnight the night before your procedure. NO GUM OR CANDY AFTER MIDNIGHT. You may drink clear liquids up to 2 hours before you are scheduled to arrive at the hospital for your procedure.  Do not drink clear liquids within 2 hours of your scheduled arrival to the hospital.  Clear liquids include  --Water or Apple juice without pulp  --Gatorade  --Black Coffee or Clear Tea (No milk, no creamers, do not add anything to the coffee or Tea-OK TO ADD SUGAR)    _X___Ensure clear carbohydrate drink-FINISH DRINK 2 HOURS PRIOR TO ARRIVAL TIME TO HOSPITAL THE DAY OF YOUR SURGERY    __x__ 2. No Alcohol for 24 hours before or after surgery.   __x__3. No Smoking or e-cigarettes for 24 prior to surgery.  Do not use any chewable tobacco products for at least 6 hour prior to surgery   ____  4. Bring all medications with you on the day of surgery if instructed.    __x__ 5. Notify your doctor if there is any change in your medical condition     (cold, fever, infections).    x___6. On the morning of surgery brush your teeth with toothpaste and water.  You may rinse your mouth with mouth wash if you wish.  Do not swallow any toothpaste or mouthwash.   Do not wear jewelry, make-up, hairpins, clips or nail polish.  Do not wear lotions, powders, or perfumes.   Do not shave 48 hours prior to surgery. Men may shave face and neck.  Do not bring valuables to the hospital.     [facility] is not responsible for any belongings or valuables.               Contacts, dentures or bridgework may not be worn into surgery.  Leave your suitcase in the car. After surgery it may be brought to your room.  For patients admitted to the hospital, discharge time is determined by your  treatment team.  _  Patients discharged the day of surgery will not be allowed to drive home.  You will need someone to drive you home and stay with you the night of your procedure.    Please read over the following fact sheets that you were given:   [facility] Preparing for Surgery/INCENTIVE SPIROMETER INSTRUCTIONS-YOU WILL BE GIVEN THE INCENTIVE SPIROMETER DEVICE THE DAY OF YOUR SURGERY  _x___ TAKE THE FOLLOWING MEDICATION THE MORNING OF SURGERY WITH A SMALL SIP OF WATER. These include:  1. ALLOPURINOL (ZYLOPRIM)  2. CRESTOR (ROSUVASTATIN)  3. PRILOSEC (OMEPRAZOLE)  4. TAKE AN EXTRA PRILOSEC THE NIGHT BEFORE YOUR SURGERY  5.  6.  ____Fleets enema or Magnesium Citrate as directed.   _x___ Use CHG Soap as directed on instruction sheet   _X___ Use inhalers on the day of surgery and bring to hospital day of surgery-USE YOUR ALBUTEROL INHALER THE MORNING OF SURGERY AND BRING INHALER TO [place]  ____ Stop Metformin and  Janumet 2 days prior to surgery.    ____ Take 1/2 of usual insulin dose the night before surgery and none on the morning surgery  ____ Follow recommendations from Cardiologist, Pulmonologist or PCP regarding  stopping Aspirin, Coumadin, Plavix ,Eliquis, Effient, or Pradaxa, and Pletal.  X____Stop Anti-inflammatories such as Advil, Aleve, Ibuprofen, Motrin, Naproxen, Naprosyn, Goodies powders or aspirin products NOW-OK to take Tylenol OR HYDROCODONE IF NEEDED   _x___ Stop supplements until after surgery-STOP MELATONIN NOW-YOU MAY RESUME AFTER YOUR SURGERY   ____ Bring C-Pap to the hospital.

## 2019-11-06 ENCOUNTER — Other Ambulatory Visit
Admission: RE | Admit: 2019-11-06 | Discharge: 2019-11-06 | Disposition: A | Discharge status: Home / Self Care | Payer: BC Managed Care – PPO | Source: Ambulatory Visit | Attending: Podiatry | Admitting: Podiatry

## 2019-11-06 DIAGNOSIS — Z01812 Encounter for preprocedural laboratory examination: Secondary | ICD-10-CM | POA: Diagnosis not present

## 2019-11-06 DIAGNOSIS — Z20822 Contact with and (suspected) exposure to covid-19: Secondary | ICD-10-CM | POA: Diagnosis not present

## 2019-11-07 ENCOUNTER — Encounter
Admission: RE | Disposition: A | Discharge status: Home / Self Care | Payer: Self-pay | Source: Home / Self Care | Attending: Podiatry

## 2019-11-07 ENCOUNTER — Ambulatory Visit: Payer: BC Managed Care – PPO

## 2019-11-07 ENCOUNTER — Encounter: Payer: Self-pay | Admitting: Podiatry

## 2019-11-07 ENCOUNTER — Ambulatory Visit: Payer: BC Managed Care – PPO | Admitting: Anesthesiology

## 2019-11-07 ENCOUNTER — Other Ambulatory Visit: Payer: Self-pay

## 2019-11-07 ENCOUNTER — Ambulatory Visit
Admission: RE | Admit: 2019-11-07 | Discharge: 2019-11-07 | Disposition: A | Discharge status: Home / Self Care | Payer: BC Managed Care – PPO | Attending: Podiatry | Admitting: Podiatry

## 2019-11-07 DIAGNOSIS — K219 Gastro-esophageal reflux disease without esophagitis: Secondary | ICD-10-CM | POA: Diagnosis not present

## 2019-11-07 DIAGNOSIS — J4520 Mild intermittent asthma, uncomplicated: Secondary | ICD-10-CM | POA: Insufficient documentation

## 2019-11-07 DIAGNOSIS — X58XXXA Exposure to other specified factors, initial encounter: Secondary | ICD-10-CM | POA: Diagnosis not present

## 2019-11-07 DIAGNOSIS — M109 Gout, unspecified: Secondary | ICD-10-CM | POA: Insufficient documentation

## 2019-11-07 DIAGNOSIS — Z79899 Other long term (current) drug therapy: Secondary | ICD-10-CM | POA: Insufficient documentation

## 2019-11-07 DIAGNOSIS — S92352A Displaced fracture of fifth metatarsal bone, left foot, initial encounter for closed fracture: Secondary | ICD-10-CM | POA: Diagnosis not present

## 2019-11-07 HISTORY — PX: ORIF TOE FRACTURE: SHX5032

## 2019-11-07 LAB — GLUCOSE, CAPILLARY: Glucose-Capillary: 107 mg/dL — ABNORMAL HIGH (ref 70–99)

## 2019-11-07 LAB — SARS CORONAVIRUS 2 (TAT 6-24 HRS): SARS Coronavirus 2: NEGATIVE

## 2019-11-07 SURGERY — OPEN REDUCTION INTERNAL FIXATION (ORIF) METATARSAL (TOE) FRACTURE
Anesthesia: General | Site: Toe | Laterality: Left

## 2019-11-07 MED ORDER — EPHEDRINE 5 MG/ML INJ
INTRAVENOUS | Status: AC
  Filled 2019-11-07: qty 10

## 2019-11-07 MED ORDER — ACETAMINOPHEN 10 MG/ML IV SOLN
INTRAVENOUS | Status: DC | PRN
  Administered 2019-11-07: 1000 mg via INTRAVENOUS

## 2019-11-07 MED ORDER — DEXAMETHASONE SODIUM PHOSPHATE 10 MG/ML IJ SOLN
INTRAMUSCULAR | Status: DC | PRN
  Administered 2019-11-07: 6 mg via INTRAVENOUS

## 2019-11-07 MED ORDER — PROMETHAZINE HCL 25 MG/ML IJ SOLN: 6.2500 mg | INTRAMUSCULAR | Status: DC | PRN

## 2019-11-07 MED ORDER — CHLORHEXIDINE GLUCONATE 0.12 % MT SOLN: 15.0000 mL | Freq: Once | OROMUCOSAL | Status: AC

## 2019-11-07 MED ORDER — ONDANSETRON HCL 4 MG PO TABS: 4.0000 mg | ORAL_TABLET | Freq: Four times a day (QID) | ORAL | Status: DC | PRN

## 2019-11-07 MED ORDER — CHLORHEXIDINE GLUCONATE 0.12 % MT SOLN
OROMUCOSAL | Status: AC
  Administered 2019-11-07: 15 mL via OROMUCOSAL
  Filled 2019-11-07: qty 15

## 2019-11-07 MED ORDER — SODIUM CHLORIDE (PF) 0.9 % IJ SOLN
INTRAMUSCULAR | Status: AC
  Filled 2019-11-07: qty 50

## 2019-11-07 MED ORDER — ROCURONIUM BROMIDE 10 MG/ML (PF) SYRINGE
PREFILLED_SYRINGE | INTRAVENOUS | Status: AC
  Filled 2019-11-07: qty 10

## 2019-11-07 MED ORDER — SUGAMMADEX SODIUM 500 MG/5ML IV SOLN
INTRAVENOUS | Status: DC | PRN
  Administered 2019-11-07: 200 mg via INTRAVENOUS

## 2019-11-07 MED ORDER — EPHEDRINE SULFATE 50 MG/ML IJ SOLN
INTRAMUSCULAR | Status: DC | PRN
  Administered 2019-11-07: 10 mg via INTRAVENOUS

## 2019-11-07 MED ORDER — LIDOCAINE HCL (CARDIAC) PF 100 MG/5ML IV SOSY
PREFILLED_SYRINGE | INTRAVENOUS | Status: DC | PRN
  Administered 2019-11-07: 100 mg via INTRAVENOUS

## 2019-11-07 MED ORDER — FENTANYL CITRATE (PF) 100 MCG/2ML IJ SOLN: 25.0000 ug | INTRAMUSCULAR | Status: DC | PRN

## 2019-11-07 MED ORDER — OXYCODONE HCL 5 MG/5ML PO SOLN: 5.0000 mg | Freq: Once | ORAL | Status: DC | PRN

## 2019-11-07 MED ORDER — POVIDONE-IODINE 7.5 % EX SOLN
Freq: Once | CUTANEOUS | Status: DC
  Filled 2019-11-07: qty 118

## 2019-11-07 MED ORDER — BUPIVACAINE HCL (PF) 0.5 % IJ SOLN
INTRAMUSCULAR | Status: AC
  Filled 2019-11-07: qty 30

## 2019-11-07 MED ORDER — ACETAMINOPHEN 10 MG/ML IV SOLN
INTRAVENOUS | Status: AC
  Filled 2019-11-07: qty 100

## 2019-11-07 MED ORDER — CEFAZOLIN SODIUM-DEXTROSE 2-4 GM/100ML-% IV SOLN
2.0000 g | INTRAVENOUS | Status: AC
  Administered 2019-11-07: 2 g via INTRAVENOUS

## 2019-11-07 MED ORDER — OXYCODONE HCL 5 MG PO TABS: 5.0000 mg | ORAL_TABLET | Freq: Once | ORAL | Status: DC | PRN

## 2019-11-07 MED ORDER — MIDAZOLAM HCL 2 MG/2ML IJ SOLN
INTRAMUSCULAR | Status: DC | PRN
  Administered 2019-11-07: 2 mg via INTRAVENOUS

## 2019-11-07 MED ORDER — PHENYLEPHRINE HCL (PRESSORS) 10 MG/ML IV SOLN
INTRAVENOUS | Status: DC | PRN
  Administered 2019-11-07: 100 ug via INTRAVENOUS

## 2019-11-07 MED ORDER — BUPIVACAINE LIPOSOME 1.3 % IJ SUSP
INTRAMUSCULAR | Status: DC | PRN
  Administered 2019-11-07: 5 mL
  Administered 2019-11-07: 10 mL

## 2019-11-07 MED ORDER — FENTANYL CITRATE (PF) 100 MCG/2ML IJ SOLN
INTRAMUSCULAR | Status: AC
  Filled 2019-11-07: qty 2

## 2019-11-07 MED ORDER — PROPOFOL 500 MG/50ML IV EMUL
INTRAVENOUS | Status: AC
  Filled 2019-11-07: qty 50

## 2019-11-07 MED ORDER — KETAMINE HCL 10 MG/ML IJ SOLN
INTRAMUSCULAR | Status: DC | PRN
  Administered 2019-11-07 (×2): 20 mg via INTRAVENOUS

## 2019-11-07 MED ORDER — ORAL CARE MOUTH RINSE: 15.0000 mL | Freq: Once | OROMUCOSAL | Status: AC

## 2019-11-07 MED ORDER — BUPIVACAINE LIPOSOME 1.3 % IJ SUSP
INTRAMUSCULAR | Status: AC
  Filled 2019-11-07: qty 20

## 2019-11-07 MED ORDER — LIDOCAINE HCL (PF) 1 % IJ SOLN
INTRAMUSCULAR | Status: AC
  Filled 2019-11-07: qty 30

## 2019-11-07 MED ORDER — MIDAZOLAM HCL 2 MG/2ML IJ SOLN
INTRAMUSCULAR | Status: AC
  Filled 2019-11-07: qty 2

## 2019-11-07 MED ORDER — DEXMEDETOMIDINE HCL 200 MCG/2ML IV SOLN
INTRAVENOUS | Status: DC | PRN
  Administered 2019-11-07: 12 ug via INTRAVENOUS

## 2019-11-07 MED ORDER — ONDANSETRON HCL 4 MG/2ML IJ SOLN: 4.0000 mg | Freq: Four times a day (QID) | INTRAMUSCULAR | Status: DC | PRN

## 2019-11-07 MED ORDER — BUPIVACAINE HCL (PF) 0.5 % IJ SOLN
INTRAMUSCULAR | Status: DC | PRN
  Administered 2019-11-07: 5 mL

## 2019-11-07 MED ORDER — PROPOFOL 10 MG/ML IV BOLUS
INTRAVENOUS | Status: DC | PRN
  Administered 2019-11-07: 180 mg via INTRAVENOUS

## 2019-11-07 MED ORDER — ONDANSETRON HCL 4 MG/2ML IJ SOLN
INTRAMUSCULAR | Status: DC | PRN
  Administered 2019-11-07: 4 mg via INTRAVENOUS

## 2019-11-07 MED ORDER — FENTANYL CITRATE (PF) 100 MCG/2ML IJ SOLN
INTRAMUSCULAR | Status: DC | PRN
Start: 2019-11-07 — End: 2019-11-07
  Administered 2019-11-07 (×2): 50 ug via INTRAVENOUS

## 2019-11-07 MED ORDER — LACTATED RINGERS IV SOLN: INTRAVENOUS | Status: DC

## 2019-11-07 MED ORDER — ROCURONIUM BROMIDE 100 MG/10ML IV SOLN
INTRAVENOUS | Status: DC | PRN
  Administered 2019-11-07: 50 mg via INTRAVENOUS

## 2019-11-07 MED ORDER — CEFAZOLIN SODIUM-DEXTROSE 2-4 GM/100ML-% IV SOLN
INTRAVENOUS | Status: AC
  Filled 2019-11-07: qty 100

## 2019-11-07 MED ORDER — MEPERIDINE HCL 50 MG/ML IJ SOLN: 6.2500 mg | INTRAMUSCULAR | Status: DC | PRN

## 2019-11-07 SURGICAL SUPPLY — 63 items
BATTERY STRYKER  REUSABLE (MISCELLANEOUS) IMPLANT
BIT DRILL 2 FAST STEP (BIT) ×2 IMPLANT
BIT DRILL 3.2MM (BIT) ×1 IMPLANT
BLADE SURG 15 STRL LF DISP TIS (BLADE) ×2 IMPLANT
BLADE SURG 15 STRL SS (BLADE) ×4
BNDG CMPR STD VLCR NS LF 5.8X4 (GAUZE/BANDAGES/DRESSINGS)
BNDG COHESIVE 4X5 TAN STRL (GAUZE/BANDAGES/DRESSINGS) ×2 IMPLANT
BNDG CONFORM 2 STRL LF (GAUZE/BANDAGES/DRESSINGS) IMPLANT
BNDG CONFORM 3 STRL LF (GAUZE/BANDAGES/DRESSINGS) ×2 IMPLANT
BNDG ELASTIC 4X5.8 VLCR NS LF (GAUZE/BANDAGES/DRESSINGS) IMPLANT
BNDG ESMARK 4X12 TAN STRL LF (GAUZE/BANDAGES/DRESSINGS) ×2 IMPLANT
BNDG GAUZE 4.5X4.1 6PLY STRL (MISCELLANEOUS) ×2 IMPLANT
BOOT STEPPER DURA LG (SOFTGOODS) ×2 IMPLANT
CANISTER SUCT 1200ML W/VALVE (MISCELLANEOUS) ×4 IMPLANT
COVER WAND RF STERILE (DRAPES) ×2 IMPLANT
CUFF TOURN SGL QUICK 12 (TOURNIQUET CUFF) IMPLANT
CUFF TOURN SGL QUICK 18X4 (TOURNIQUET CUFF) ×2 IMPLANT
DRAPE FLUOR MINI C-ARM 54X84 (DRAPES) ×2 IMPLANT
DRILL BIT (BIT) ×1
DURAPREP 26ML APPLICATOR (WOUND CARE) ×2 IMPLANT
ELECT REM PT RETURN 9FT ADLT (ELECTROSURGICAL) ×2
ELECTRODE REM PT RTRN 9FT ADLT (ELECTROSURGICAL) ×1 IMPLANT
GAUZE SPONGE 4X4 12PLY STRL (GAUZE/BANDAGES/DRESSINGS) ×2 IMPLANT
GAUZE XEROFORM 1X8 LF (GAUZE/BANDAGES/DRESSINGS) ×2 IMPLANT
GLOVE BIO SURGEON STRL SZ7.5 (GLOVE) ×2 IMPLANT
GLOVE INDICATOR 8.0 STRL GRN (GLOVE) ×2 IMPLANT
GOWN STRL REUS W/ TWL XL LVL3 (GOWN DISPOSABLE) ×2 IMPLANT
GOWN STRL REUS W/TWL XL LVL3 (GOWN DISPOSABLE) ×4
K-WIRE 1.6 (WIRE) ×2
K-WIRE FX5X1.6XNS BN SS (WIRE) ×1
KWIRE FX5X1.6XNS BN SS (WIRE) ×1 IMPLANT
MANIFOLD NEPTUNE II (INSTRUMENTS) ×2 IMPLANT
NEEDLE FILTER BLUNT 18X 1/2SAF (NEEDLE) ×1
NEEDLE FILTER BLUNT 18X1 1/2 (NEEDLE) ×1 IMPLANT
NEEDLE HYPO 22GX1.5 SAFETY (NEEDLE) ×2 IMPLANT
NEEDLE HYPO 25X1 1.5 SAFETY (NEEDLE) ×4 IMPLANT
NS IRRIG 500ML POUR BTL (IV SOLUTION) ×2 IMPLANT
PACK EXTREMITY (MISCELLANEOUS) ×2 IMPLANT
PADDING CAST BLEND 4X4 NS (MISCELLANEOUS) IMPLANT
PENCIL ELECTRO HAND CTR (MISCELLANEOUS) ×2 IMPLANT
PENCIL SMOKE ULTRAEVAC 22 CON (MISCELLANEOUS) IMPLANT
PLATE F3 FRAG HI STR Y (Plate) ×2 IMPLANT
SCREW PEG 2.5X10 NONLOCK (Screw) ×2 IMPLANT
SCREW PEG 2.5X14 NONLOCK (Screw) ×2 IMPLANT
SCREW PEG 2.5X16 NONLOCK (Screw) ×2 IMPLANT
SCREW PEG LOCK 2.5X10 (Peg) ×4 IMPLANT
SCREW PEG LOCK 2.5X12 (Screw) ×8 IMPLANT
SPLINT CAST 1 STEP 4X30 (MISCELLANEOUS) IMPLANT
SPLINT FAST PLASTER 5X30 (CAST SUPPLIES) ×1
SPLINT PLASTER CAST FAST 5X30 (CAST SUPPLIES) ×1 IMPLANT
STOCKINETTE M/LG 89821 (MISCELLANEOUS) ×2 IMPLANT
STRAP SAFETY 5IN WIDE (MISCELLANEOUS) IMPLANT
STRIP CLOSURE SKIN 1/4X4 (GAUZE/BANDAGES/DRESSINGS) IMPLANT
SUT MNCRL 4-0 (SUTURE) ×2
SUT MNCRL 4-0 27XMFL (SUTURE) ×1
SUT VIC AB 3-0 SH 27 (SUTURE) ×2
SUT VIC AB 3-0 SH 27X BRD (SUTURE) ×1 IMPLANT
SUT VIC AB 4-0 FS2 27 (SUTURE) ×4 IMPLANT
SUTURE MNCRL 4-0 27XMF (SUTURE) ×1 IMPLANT
SWABSTK COMLB BENZOIN TINCTURE (MISCELLANEOUS) IMPLANT
SYR 10ML LL (SYRINGE) ×2 IMPLANT
SYR 50ML LL SCALE MARK (SYRINGE) IMPLANT
WIRE Z .062 C-WIRE SPADE TIP (WIRE) ×2 IMPLANT

## 2019-11-07 NOTE — Transfer of Care (Signed)
Immediate Anesthesia Transfer of Care Note  Patient: Gilbert Lee  Procedure(s) Performed: OPEN REDUCTION INTERNAL FIXATION (ORIF) METATARSAL (TOE) FRACTURE LEFT 5TH (Left Toe)  Patient Location: PACU  Anesthesia Type:General  Level of Consciousness: drowsy  Airway & Oxygen Therapy: Patient Spontanous Breathing and Patient connected to face mask oxygen  Post-op Assessment: Report given to RN and Post -op Vital signs reviewed and stable  Post vital signs: Reviewed and stable  Last Vitals:  Vitals Value Taken Time  BP 137/77 11/07/19 1152  Temp    Pulse 85 11/07/19 1157  Resp 16 11/07/19 1157  SpO2 95 % 11/07/19 1157  Vitals shown include unvalidated device data.  Last Pain:  Vitals:   11/07/19 0918  TempSrc: Tympanic  PainSc: 0-No pain         Complications: No complications documented.

## 2019-11-07 NOTE — Progress Notes (Signed)
Called pt and told pt and his wife no weight bearing on operative foot

## 2019-11-07 NOTE — Anesthesia Postprocedure Evaluation (Signed)
Anesthesia Post Note  Patient: Gilbert Lee  Procedure(s) Performed: OPEN REDUCTION INTERNAL FIXATION (ORIF) METATARSAL (TOE) FRACTURE LEFT 5TH (Left Toe)  Patient location during evaluation: PACU Anesthesia Type: General Level of consciousness: awake and alert and oriented Pain management: pain level controlled Vital Signs Assessment: post-procedure vital signs reviewed and stable Respiratory status: spontaneous breathing, nonlabored ventilation and respiratory function stable Cardiovascular status: blood pressure returned to baseline and stable Postop Assessment: no signs of nausea or vomiting Anesthetic complications: no   No complications documented.   Last Vitals:  Vitals:   11/07/19 1156 11/07/19 1231  BP:  128/85  Pulse: 84 70  Resp: 15 12  Temp: (!) 36.3 C (!) 36.2 C  SpO2: 97% 93%    Last Pain:  Vitals:   11/07/19 1231  TempSrc:   PainSc: 0-No pain                 Silver Parkey

## 2019-11-07 NOTE — Anesthesia Preprocedure Evaluation (Signed)
Anesthesia Evaluation  Patient identified by MRN, date of birth, ID band Patient awake    Reviewed: Allergy & Precautions, NPO status , Patient's Chart, lab work & pertinent test results  History of Anesthesia Complications Negative for: history of anesthetic complications  Airway Mallampati: III  TM Distance: >3 FB Neck ROM: Full    Dental no notable dental hx.    Pulmonary asthma (mild intermittent) , neg sleep apnea,    breath sounds clear to auscultation- rhonchi (-) wheezing      Cardiovascular Exercise Tolerance: Good (-) hypertension(-) CAD, (-) Past MI, (-) Cardiac Stents and (-) CABG  Rhythm:Regular Rate:Normal - Systolic murmurs and - Diastolic murmurs    Neuro/Psych neg Seizures negative neurological ROS  negative psych ROS   GI/Hepatic Neg liver ROS, hiatal hernia, GERD  ,  Endo/Other  neg diabetes  Renal/GU negative Renal ROS     Musculoskeletal  (+) Arthritis ,   Abdominal (+) + obese,   Peds  Hematology negative hematology ROS (+)   Anesthesia Other Findings Past Medical History: No date: Arthritis No date: Asthma     Comment:  well controlled No date: GERD (gastroesophageal reflux disease) No date: Gout No date: History of hiatal hernia   Reproductive/Obstetrics                             Anesthesia Physical Anesthesia Plan  ASA: II  Anesthesia Plan: General   Post-op Pain Management:    Induction: Intravenous  PONV Risk Score and Plan: 1  Airway Management Planned: Oral ETT  Additional Equipment:   Intra-op Plan:   Post-operative Plan: Extubation in OR  Informed Consent: I have reviewed the patients History and Physical, chart, labs and discussed the procedure including the risks, benefits and alternatives for the proposed anesthesia with the patient or authorized representative who has indicated his/her understanding and acceptance.     Dental  advisory given  Plan Discussed with: CRNA and Anesthesiologist  Anesthesia Plan Comments:         Anesthesia Quick Evaluation

## 2019-11-07 NOTE — Op Note (Signed)
Operative note   Surgeon:Danie Diehl Lawyer: None    Preop diagnosis: 1.  Left fifth metatarsal fracture     Postop diagnosis: Same    Procedure: 1.  ORIF left fifth metatarsal fracture 2 use of fluoroscopy without assistance of radiologist    EBL: Minimal    Anesthesia:local and general.  Local consisted of a total of 5 cc of 0.5% bupivacaine and 15 cc of Exparel long-acting anesthetic    Hemostasis: Ankle tourniquet inflated to 200 mmHg for approximately 65 minutes    Specimen: None    Complications: None    Operative indications:Gilbert Lee is an 54 y.o. that presents today for surgical intervention.  The risks/benefits/alternatives/complications have been discussed and consent has been given.    Procedure:  Patient was brought into the OR and placed on the operating table in thesupine position. After anesthesia was obtained theleft lower extremity was prepped and draped in usual sterile fashion.  Attention was directed to the left fifth metatarsal where a longitudinal incision performed overlying the dorsal lateral aspect.  Sharp and blunt dissection carried down to the periosteum.  Subperiosteal dissection was then undertaken.  At this time the displaced fracture was noted.  There was noted to be a comminuted butterfly fragment at both the most proximal aspect as well as the distal plantar aspect of the fracture itself.  The fracture site with then cleared of debris.  Bone reduction clamp was used to reduce the fracture.  A 2.5 millimeter screw was used for a compression lag screw across the fracture itself.  Next a small T plate was placed dorsally from the [facility] fragment set.  Multiple screws were then used both proximal and distal for stabilization.  Good alignment was noted in all planes.  The very small fracture fragment at the proximal aspect was removed.  The slightly larger plantar loose fragment was impacted into the fracture itself at this time.  Fluoroscopy  was used to note all alignment films and good alignment was noted.  At this time the wound was flushed with copious amounts of irrigation.  Layered closure was then performed with a 3-0 and 4-0 Vicryl for the deeper tissues and a 4-0 Monocryl for the skin.  Patient was placed in a tall equalizer walker boot at the end of the case.    Patient tolerated the procedure and anesthesia well.  Was transported from the OR to the PACU with all vital signs stable and vascular status intact. To be discharged per routine protocol.  Will follow up in approximately 1 week in the outpatient clinic.

## 2019-11-07 NOTE — Discharge Instructions (Signed)
       AMBULATORY SURGERY  DISCHARGE INSTRUCTIONS   1) The drugs that you were given will stay in your system until tomorrow so for the next 24 hours you should not:  A) Drive an automobile B) Make any legal decisions C) Drink any alcoholic beverage   2) You may resume regular meals tomorrow.  Today it is better to start with liquids and gradually work up to solid foods.  You may eat anything you prefer, but it is better to start with liquids, then soup and crackers, and gradually work up to solid foods.   3) Please notify your doctor immediately if you have any unusual bleeding, trouble breathing, redness and pain at the surgery site, drainage, fever, or pain not relieved by medication.    4) Additional Instructions:        Please contact your physician with any problems or Same Day Surgery at [phone], Monday through Friday 6 am to 4 pm, or [place] [facility] [place] [facility] number at [phone].  [facility]  POST OPERATIVE INSTRUCTIONS FOR DR. TROXLER, DR. FOWLER, AND DR. [facility] PODIATRY [facility]PART[facility]   1. Take your medication as prescribed.  Pain medication should be taken only as needed.  2. Keep the dressing clean, dry and intact.  3. Keep your foot elevated above the heart level for the first 48 hours.  4. Walking to the bathroom and brief periods of walking are acceptable, unless we have instructed you to be non-weight bearing.  5. Always wear your post-op shoe when walking.  Always use your crutches if you are to be non-weight bearing.  6. Do not take a shower. Baths are permissible as long as the foot is kept out of the water.   7. Every hour you are awake:  - Bend your knee 15 times. - Flex foot 15 times - Massage calf 15 times  8. Call [facility] ([phone]) if any of the following problems occur: - You develop a temperature or fever. - The bandage becomes saturated with  blood. - Medication does not stop your pain. - Injury of the foot occurs. - Any symptoms of infection including redness, odor, or red streaks running from wound.  

## 2019-11-07 NOTE — Anesthesia Procedure Notes (Signed)
Procedure Name: Intubation Date/Time: 11/07/2019 10:15 AM Performed by: Lia Foyer, CRNA Pre-anesthesia Checklist: Patient identified, Emergency Drugs available, Suction available and Patient being monitored Patient Re-evaluated:Patient Re-evaluated prior to induction Oxygen Delivery Method: Circle system utilized Preoxygenation: Pre-oxygenation with 100% oxygen Induction Type: IV induction Ventilation: Mask ventilation without difficulty Laryngoscope Size: McGraph and 4 Grade View: Grade I Tube type: Oral Tube size: 7.5 mm Number of attempts: 1 Airway Equipment and Method: Stylet,  Oral airway and Video-laryngoscopy Placement Confirmation: ETT inserted through vocal cords under direct vision,  positive ETCO2 and breath sounds checked- equal and bilateral Secured at: 21 cm Tube secured with: Tape Dental Injury: Teeth and Oropharynx as per pre-operative assessment

## 2019-11-08 ENCOUNTER — Encounter: Payer: Self-pay | Admitting: Podiatry

## 2019-11-12 DIAGNOSIS — M79672 Pain in left foot: Secondary | ICD-10-CM | POA: Diagnosis not present

## 2019-12-11 ENCOUNTER — Other Ambulatory Visit: Payer: Self-pay | Admitting: Family Medicine

## 2019-12-11 NOTE — Telephone Encounter (Signed)
   Notes to clinic Historical Provider  

## 2019-12-12 NOTE — Telephone Encounter (Signed)
Please advise 

## 2019-12-17 DIAGNOSIS — S92352D Displaced fracture of fifth metatarsal bone, left foot, subsequent encounter for fracture with routine healing: Secondary | ICD-10-CM | POA: Diagnosis not present

## 2019-12-26 DIAGNOSIS — Z20822 Contact with and (suspected) exposure to covid-19: Secondary | ICD-10-CM | POA: Diagnosis not present

## 2020-01-28 DIAGNOSIS — S92352D Displaced fracture of fifth metatarsal bone, left foot, subsequent encounter for fracture with routine healing: Secondary | ICD-10-CM | POA: Diagnosis not present

## 2020-04-07 ENCOUNTER — Encounter: Payer: Self-pay | Admitting: Family Medicine

## 2020-07-27 ENCOUNTER — Other Ambulatory Visit: Payer: Self-pay | Admitting: Family Medicine

## 2020-07-27 DIAGNOSIS — M1A00X0 Idiopathic chronic gout, unspecified site, without tophus (tophi): Secondary | ICD-10-CM

## 2020-07-27 DIAGNOSIS — E790 Hyperuricemia without signs of inflammatory arthritis and tophaceous disease: Secondary | ICD-10-CM

## 2020-07-27 DIAGNOSIS — E785 Hyperlipidemia, unspecified: Secondary | ICD-10-CM

## 2020-07-27 NOTE — Telephone Encounter (Signed)
Requested medication (s) are due for refill today: yes  Requested medication (s) are on the active medication list: yes  Last refill:  04/30/2020  Future visit scheduled:no  Notes to clinic:  overdue for office visit and labs Message sent to patient to contact the office    Requested Prescriptions  Pending Prescriptions Disp Refills   rosuvastatin (CRESTOR) 20 MG tablet [Pharmacy Med Name: ROSUVASTATIN CALCIUM 20 MG TAB] 90 tablet 3    Sig: TAKE 1 TABLET BY MOUTH EVERY DAY      Cardiovascular:  Antilipid - Statins Failed - 07/27/2020  2:26 PM      Failed - Total Cholesterol in normal range and within 360 days    Cholesterol, Total  Date Value Ref Range Status  07/29/2019 154 100 - 199 mg/dL Final          Failed - LDL in normal range and within 360 days    LDL Cholesterol (Calc)  Date Value Ref Range Status  01/16/2017 162 (H) mg/dL (calc) Final    Comment:    Reference range: <100 . Desirable range <100 mg/dL for primary prevention;   <70 mg/dL for patients with CHD or diabetic patients  with > or = 2 CHD risk factors. Marland Kitchen LDL-C is now calculated using the Martin-Hopkins  calculation, which is a validated novel method providing  better accuracy than the Friedewald equation in the  estimation of LDL-C.  Cresenciano Genre et al. Annamaria Helling. 6789;381(01): 2061-2068  (http://education.[employer].com/faq/FAQ164)    LDL Chol Calc (NIH)  Date Value Ref Range Status  07/29/2019 83 0 - 99 mg/dL Final          Failed - HDL in normal range and within 360 days    HDL  Date Value Ref Range Status  07/29/2019 42 >39 mg/dL Final          Failed - Triglycerides in normal range and within 360 days    Triglycerides  Date Value Ref Range Status  07/29/2019 167 (H) 0 - 149 mg/dL Final          Failed - Valid encounter within last 12 months    Recent Outpatient Visits           1 year ago Hyperlipidemia, unspecified hyperlipidemia type   [facility] Birdie Sons, MD   2 years ago Hyperlipidemia, unspecified hyperlipidemia type   [facility] Birdie Sons, MD   2 years ago Annual physical exam   [facility] Birdie Sons, MD   3 years ago Hyperuricemia   [facility] Birdie Sons, MD   4 years ago Right foot pain   [facility] Birdie Sons, MD                Passed - Patient is not pregnant        allopurinol (ZYLOPRIM) 300 MG tablet [Pharmacy Med Name: ALLOPURINOL 300 MG TABLET] 90 tablet 3    Sig: TAKE 1 TABLET (300 MG TOTAL) BY MOUTH DAILY. TAKE WITH 100MG  TAB FOR 400MG  TOTAL A DAY      Endocrinology:  Gout Agents Failed - 07/27/2020  2:26 PM      Failed - Uric Acid in normal range and within 360 days    Uric Acid  Date Value Ref Range Status  07/29/2019 4.7 3.8 - 8.4 mg/dL Final    Comment:               Therapeutic target  for gout patients: <6.0          Failed - Cr in normal range and within 360 days    Creatinine, Ser  Date Value Ref Range Status  07/29/2019 1.15 0.76 - 1.27 mg/dL Final          Failed - Valid encounter within last 12 months    Recent Outpatient Visits           1 year ago Hyperlipidemia, unspecified hyperlipidemia type   [facility] Birdie Sons, MD   2 years ago Hyperlipidemia, unspecified hyperlipidemia type   [facility] Birdie Sons, MD   2 years ago Annual physical exam   [facility] Birdie Sons, MD   3 years ago Hyperuricemia   [facility] Birdie Sons, MD   4 years ago Right foot pain   [facility] Birdie Sons, MD

## 2020-07-28 NOTE — Telephone Encounter (Signed)
Is due for office visit. Please schedule. Have sent 90 day supply to get by until seen in office.

## 2020-07-28 NOTE — Telephone Encounter (Signed)
I called patient and patient verbalized understanding of information below.    Appt has been made for May 25th at 8:20am

## 2020-08-11 ENCOUNTER — Other Ambulatory Visit: Payer: Self-pay

## 2020-08-11 ENCOUNTER — Encounter: Payer: Self-pay | Admitting: Family Medicine

## 2020-08-11 ENCOUNTER — Ambulatory Visit: Payer: BC Managed Care – PPO | Admitting: Family Medicine

## 2020-08-11 VITALS — BP 125/84 | HR 78 | Temp 97.7°F | Wt 265.0 lb

## 2020-08-11 DIAGNOSIS — Z23 Encounter for immunization: Secondary | ICD-10-CM | POA: Diagnosis not present

## 2020-08-11 DIAGNOSIS — R4586 Emotional lability: Secondary | ICD-10-CM

## 2020-08-11 DIAGNOSIS — E785 Hyperlipidemia, unspecified: Secondary | ICD-10-CM | POA: Diagnosis not present

## 2020-08-11 DIAGNOSIS — E790 Hyperuricemia without signs of inflammatory arthritis and tophaceous disease: Secondary | ICD-10-CM | POA: Diagnosis not present

## 2020-08-11 DIAGNOSIS — K2100 Gastro-esophageal reflux disease with esophagitis, without bleeding: Secondary | ICD-10-CM | POA: Diagnosis not present

## 2020-08-11 DIAGNOSIS — Z125 Encounter for screening for malignant neoplasm of prostate: Secondary | ICD-10-CM | POA: Diagnosis not present

## 2020-08-11 NOTE — Progress Notes (Signed)
Established patient visit   Patient: Gilbert Lee   DOB: 03/08/1966   55 y.o. Male  MRN: [id number] Visit Date: 08/11/2020  Today's healthcare provider: Lelon Huh, MD   Chief Complaint  Patient presents with  . Hyperlipidemia  . Anxiety   Subjective    Anxiety Presents for initial visit. The problem has been gradually worsening. Symptoms include decreased concentration, excessive worry and nervous/anxious behavior. Patient reports no depressed mood, dizziness, insomnia, panic or suicidal ideas.      Follow up for asthma  The patient was last seen for this 1 years ago. Changes made at last visit include no changes.  He reports excellent compliance with treatment. He feels that condition is worsening, he states since testing positive for Covid about a month ago his is needing to use his inhaler more often. Marland Kitchen He is not having side effects.   ----------------------------------------------------------------------------------------- Lipid/Cholesterol, Follow-up  Last lipid panel Other pertinent labs  Lab Results  Component Value Date   CHOL 154 07/29/2019   HDL 42 07/29/2019   LDLCALC 83 07/29/2019   TRIG 167 (H) 07/29/2019   CHOLHDL 3.7 07/29/2019   Lab Results  Component Value Date   ALT 29 07/29/2019   AST 27 07/29/2019   PLT 189 07/29/2019   TSH 1.610 10/11/2015     He was last seen for this 1 years ago.  Management since that visit includes no changes.  He reports excellent compliance with treatment. He is not having side effects.   Symptoms: No chest pain No chest pressure/discomfort  No dyspnea No lower extremity edema  No numbness or tingling of extremity No orthopnea  No palpitations No paroxysmal nocturnal dyspnea  No speech difficulty No syncope   Current diet: in general, a "healthy" diet   Current exercise: none  The 10-year ASCVD risk score Mikey Bussing DC Jr., et al., 2013) is:  5.1%  ---------------------------------------------------------------------------------------------------   No Known Allergies   Medications: Outpatient Medications Prior to Visit  Medication Sig  . albuterol (VENTOLIN HFA) 108 (90 Base) MCG/ACT inhaler Inhale 1-2 puffs into the lungs every 6 (six) hours as needed for shortness of breath.  . allopurinol (ZYLOPRIM) 100 MG tablet TAKE 1 TABLET BY MOUTH EVERY DAY WITH 300MG   . allopurinol (ZYLOPRIM) 300 MG tablet TAKE 1 TABLET (300 MG TOTAL) BY MOUTH DAILY. TAKE WITH 100MG  TAB FOR 400MG  TOTAL A DAY  . Cholecalciferol (VITAMIN D3) 50 MCG (2000 UT) TABS Take 2,000 Units by mouth daily.   Marland Kitchen MELATONIN PO Take 3 mg by mouth at bedtime as needed (Sleep).   Marland Kitchen omeprazole (PRILOSEC OTC) 20 MG tablet Take 20 mg by mouth every morning.  . rosuvastatin (CRESTOR) 20 MG tablet Take 1 tablet (20 mg total) by mouth daily.  Marland Kitchen zinc gluconate 50 MG tablet Take 50 mg by mouth daily.  . [DISCONTINUED] HYDROcodone-acetaminophen (NORCO/VICODIN) 5-325 MG tablet Take 1 tablet by mouth every 6 (six) hours as needed.   . [DISCONTINUED] magnesium oxide (MAG-OX) 400 MG tablet Take 800 mg by mouth once a week.   . [DISCONTINUED] naproxen (NAPROSYN) 500 MG tablet Take 500 mg by mouth 2 (two) times daily.  . [DISCONTINUED] omeprazole (PRILOSEC) 40 MG capsule Take 1 capsule (40 mg total) by mouth daily before breakfast. (Patient not taking: Reported on 11/05/2019)   No facility-administered medications prior to visit.    Review of Systems  Constitutional: Negative.   Respiratory: Positive for wheezing. Negative for apnea, cough, choking, chest tightness and  stridor.   Gastrointestinal: Negative.   Neurological: Negative for dizziness, light-headedness and headaches.  Psychiatric/Behavioral: Positive for decreased concentration. Negative for dysphoric mood, self-injury, sleep disturbance and suicidal ideas. The patient is nervous/anxious. The patient does not have  insomnia.        Objective    BP 125/84 (BP Location: Right Arm, Patient Position: Sitting, Cuff Size: Large)   Pulse 78   Temp 97.7 F (36.5 C) (Oral)   Wt 265 lb (120.2 kg)   SpO2 98%   BMI (P) 36.96 kg/m     Physical Exam   General: Appearance:    Obese male in no acute distress  Eyes:    PERRL, conjunctiva/corneas clear, EOM's intact       Lungs:     Clear to auscultation bilaterally, respirations unlabored  Heart:    Normal heart rate. Normal rhythm. No murmurs, rubs, or gallops.   MS:   All extremities are intact.   Neurologic:   Awake, alert, oriented x 3. No apparent focal neurological           defect.         Assessment & Plan     1. Hyperlipidemia, unspecified hyperlipidemia type Doing well with rosuvastatin.  - CBC - Comprehensive metabolic panel - Lipid panel  2. Hyperuricemia No gout flares since being on current allopurinol regiment.  - Uric acid  3. Gastroesophageal reflux disease with esophagitis without hemorrhage Will controlled on OTC omeprazole.  - Magnesium  4. Prostate cancer screening  - PSA Total (Reflex To Free) ([facility] only)  5. Mood changes More anxious the last year or two.  - Testosterone,Free and Total  6. Need for shingles vaccine  - Administer Zoster, Recombinant (Shingrix) Vaccine #1       The entirety of the information documented in the History of Present Illness, Review of Systems and Physical Exam were personally obtained by me. Portions of this information were initially documented by the CMA and reviewed by me for thoroughness and accuracy.      Lelon Huh, MD  [facility] [phone] (phone) [phone] (fax)  [place]

## 2020-08-11 NOTE — Patient Instructions (Signed)
.   I strongly recommend at least one booster dose (a third shot) of the Covid vaccine for all adults. People at high risk for serious Covid infections should have a second booster dose 4-6 months after the first booster.  

## 2020-08-13 ENCOUNTER — Encounter: Payer: Self-pay | Admitting: Family Medicine

## 2020-08-13 DIAGNOSIS — E291 Testicular hypofunction: Secondary | ICD-10-CM

## 2020-08-13 LAB — LIPID PANEL
Chol/HDL Ratio: 3.8 ratio (ref 0.0–5.0)
Cholesterol, Total: 172 mg/dL (ref 100–199)
HDL: 45 mg/dL (ref >39)
LDL Chol Calc (NIH): 104 mg/dL — ABNORMAL HIGH (ref 0–99)
Triglycerides: 126 mg/dL (ref 0–149)
VLDL Cholesterol Cal: 23 mg/dL (ref 5–40)

## 2020-08-13 LAB — COMPREHENSIVE METABOLIC PANEL
ALT: 36 IU/L (ref 0–44)
AST: 32 IU/L (ref 0–40)
Albumin/Globulin Ratio: 2 (ref 1.2–2.2)
Albumin: 4.4 g/dL (ref 3.8–4.9)
Alkaline Phosphatase: 48 IU/L (ref 44–121)
BUN/Creatinine Ratio: 22 — ABNORMAL HIGH (ref 9–20)
BUN: 22 mg/dL (ref 6–24)
Bilirubin Total: 0.4 mg/dL (ref 0.0–1.2)
CO2: 25 mmol/L (ref 20–29)
Calcium: 9.3 mg/dL (ref 8.7–10.2)
Chloride: 108 mmol/L — ABNORMAL HIGH (ref 96–106)
Creatinine, Ser: 1.01 mg/dL (ref 0.76–1.27)
Globulin, Total: 2.2 g/dL (ref 1.5–4.5)
Glucose: 111 mg/dL — ABNORMAL HIGH (ref 65–99)
Potassium: 4 mmol/L (ref 3.5–5.2)
Sodium: 147 mmol/L — ABNORMAL HIGH (ref 134–144)
Total Protein: 6.6 g/dL (ref 6.0–8.5)
eGFR: 87 mL/min/{1.73_m2} (ref >59)

## 2020-08-13 LAB — CBC
Hematocrit: 43.8 % (ref 37.5–51.0)
Hemoglobin: 14.3 g/dL (ref 13.0–17.7)
MCH: 29.7 pg (ref 26.6–33.0)
MCHC: 32.6 g/dL (ref 31.5–35.7)
MCV: 91 fL (ref 79–97)
Platelets: 158 10*3/uL (ref 150–450)
RBC: 4.81 x10E6/uL (ref 4.14–5.80)
RDW: 13.2 % (ref 11.6–15.4)
WBC: 7.1 10*3/uL (ref 3.4–10.8)

## 2020-08-13 LAB — PSA TOTAL (REFLEX TO FREE): Prostate Specific Ag, Serum: 0.9 ng/mL (ref 0.0–4.0)

## 2020-08-13 LAB — TESTOSTERONE,FREE AND TOTAL
Testosterone, Free: 4.8 pg/mL — ABNORMAL LOW (ref 7.2–24.0)
Testosterone: 253 ng/dL — ABNORMAL LOW (ref 264–916)

## 2020-08-13 LAB — MAGNESIUM: Magnesium: 2.1 mg/dL (ref 1.6–2.3)

## 2020-08-13 LAB — URIC ACID: Uric Acid: 5.7 mg/dL (ref 3.8–8.4)

## 2020-08-19 DIAGNOSIS — E291 Testicular hypofunction: Secondary | ICD-10-CM | POA: Diagnosis not present

## 2020-08-20 ENCOUNTER — Other Ambulatory Visit: Payer: Self-pay | Admitting: Family Medicine

## 2020-08-20 DIAGNOSIS — E291 Testicular hypofunction: Secondary | ICD-10-CM

## 2020-08-20 MED ORDER — TESTOSTERONE 20.25 MG/1.25GM (1.62%) TD GEL: 2.0000 | Freq: Every day | TRANSDERMAL | 1 refills | Status: DC

## 2020-08-21 LAB — TESTOSTERONE,FREE AND TOTAL
Testosterone, Free: 3.8 pg/mL — ABNORMAL LOW (ref 7.2–24.0)
Testosterone: 226 ng/dL — ABNORMAL LOW (ref 264–916)

## 2020-08-21 LAB — PROLACTIN: Prolactin: 10.4 ng/mL (ref 4.0–15.2)

## 2020-10-22 ENCOUNTER — Other Ambulatory Visit: Payer: Self-pay | Admitting: Family Medicine

## 2020-10-22 DIAGNOSIS — E790 Hyperuricemia without signs of inflammatory arthritis and tophaceous disease: Secondary | ICD-10-CM

## 2020-10-25 ENCOUNTER — Telehealth: Payer: Self-pay | Admitting: Family Medicine

## 2020-10-25 ENCOUNTER — Other Ambulatory Visit: Payer: Self-pay | Admitting: Family Medicine

## 2020-10-25 DIAGNOSIS — E785 Hyperlipidemia, unspecified: Secondary | ICD-10-CM

## 2020-10-25 DIAGNOSIS — E291 Testicular hypofunction: Secondary | ICD-10-CM

## 2020-10-25 NOTE — Telephone Encounter (Signed)
   Requested medication (s) are on the active medication list: yes   Last refill:  09/17/2020  Future visit scheduled: no  Notes to clinic:  Medication not assigned to a protocol, review manually   Requested Prescriptions  Pending Prescriptions Disp Refills   Testosterone 20.25 MG/ACT (1.62%) GEL [Pharmacy Med Name: TESTOSTERONE 1.62% GEL PUMP] 75 g 1    Sig: PLACE 2 PUMP ONTO THE SKIN DAILY.      Off-Protocol Failed - 10/25/2020  2:02 AM      Failed - Medication not assigned to a protocol, review manually.      Passed - Valid encounter within last 12 months    Recent Outpatient Visits           2 months ago Hyperlipidemia, unspecified hyperlipidemia type   [facility] Birdie Sons, MD   1 year ago Hyperlipidemia, unspecified hyperlipidemia type   [facility] Birdie Sons, MD   2 years ago Hyperlipidemia, unspecified hyperlipidemia type   [facility] Birdie Sons, MD   3 years ago Annual physical exam   [facility] Birdie Sons, MD   3 years ago Hyperuricemia   [facility] Birdie Sons, MD

## 2020-10-26 ENCOUNTER — Telehealth: Payer: Self-pay

## 2020-10-26 NOTE — Telephone Encounter (Signed)
Ok to schedule second Shingrix. Please print and leave order for testosterone levels at lab.

## 2020-10-26 NOTE — Telephone Encounter (Signed)
Copied from [place] [id number]. Topic: General - Other >> Oct 26, 2020 12:31 PM Pawlus, Brayton Layman A wrote: Reason for CRM: Pt wanted to know if Dr Caryn Section would place blood work orders since he was recently put on testosterone, pt also wanted to get his shingles vaccine. Please advise.

## 2020-10-27 ENCOUNTER — Other Ambulatory Visit: Payer: Self-pay

## 2020-10-27 DIAGNOSIS — E790 Hyperuricemia without signs of inflammatory arthritis and tophaceous disease: Secondary | ICD-10-CM

## 2020-10-27 DIAGNOSIS — E785 Hyperlipidemia, unspecified: Secondary | ICD-10-CM

## 2020-10-27 NOTE — Telephone Encounter (Signed)
Patient advised. He is going to have labs done tomorrow. Lab order printed and placed up front. Patient will schedule Shingles vaccine at that time.

## 2020-10-27 NOTE — Telephone Encounter (Signed)
Patient is now going to Gilbert Lee for his medications.  He needs Allopurinol 100 mg, Allopurinol 300 mg. And Crestor 20 mg. Sent to Gilbert Lee ASAP please.

## 2020-10-28 DIAGNOSIS — E291 Testicular hypofunction: Secondary | ICD-10-CM | POA: Diagnosis not present

## 2020-10-28 MED ORDER — ALLOPURINOL 100 MG PO TABS: ORAL_TABLET | ORAL | 4 refills | Status: DC

## 2020-10-28 MED ORDER — ROSUVASTATIN CALCIUM 20 MG PO TABS: 20.0000 mg | ORAL_TABLET | Freq: Every day | ORAL | 4 refills | Status: DC

## 2020-10-28 MED ORDER — ALLOPURINOL 300 MG PO TABS: 300.0000 mg | ORAL_TABLET | Freq: Every day | ORAL | 4 refills | Status: DC

## 2020-10-29 ENCOUNTER — Other Ambulatory Visit: Payer: Self-pay | Admitting: Family Medicine

## 2020-10-29 DIAGNOSIS — E291 Testicular hypofunction: Secondary | ICD-10-CM

## 2020-10-29 MED ORDER — TESTOSTERONE 20.25 MG/ACT (1.62%) TD GEL: 3.0000 | Freq: Every day | TRANSDERMAL | 0 refills | Status: DC

## 2020-11-02 ENCOUNTER — Ambulatory Visit: Payer: BC Managed Care – PPO | Admitting: Family Medicine

## 2020-11-02 ENCOUNTER — Encounter: Payer: Self-pay | Admitting: Family Medicine

## 2020-11-02 LAB — CBC
Hematocrit: 41.9 % (ref 37.5–51.0)
Hemoglobin: 13.9 g/dL (ref 13.0–17.7)
MCH: 30.2 pg (ref 26.6–33.0)
MCHC: 33.2 g/dL (ref 31.5–35.7)
MCV: 91 fL (ref 79–97)
Platelets: 190 10*3/uL (ref 150–450)
RBC: 4.6 x10E6/uL (ref 4.14–5.80)
RDW: 13.1 % (ref 11.6–15.4)
WBC: 6.9 10*3/uL (ref 3.4–10.8)

## 2020-11-02 LAB — TESTOSTERONE,FREE AND TOTAL
Testosterone, Free: 2.1 pg/mL — ABNORMAL LOW (ref 7.2–24.0)
Testosterone: 122 ng/dL — ABNORMAL LOW (ref 264–916)

## 2020-11-11 ENCOUNTER — Ambulatory Visit: Payer: BC Managed Care – PPO | Admitting: Family Medicine

## 2020-12-13 ENCOUNTER — Encounter: Payer: Self-pay | Admitting: Family Medicine

## 2020-12-13 ENCOUNTER — Other Ambulatory Visit: Payer: Self-pay

## 2020-12-13 ENCOUNTER — Ambulatory Visit: Payer: BC Managed Care – PPO | Admitting: Family Medicine

## 2020-12-13 VITALS — BP 125/79 | HR 79 | Resp 16 | Wt 267.0 lb

## 2020-12-13 DIAGNOSIS — E291 Testicular hypofunction: Secondary | ICD-10-CM | POA: Diagnosis not present

## 2020-12-13 DIAGNOSIS — Z23 Encounter for immunization: Secondary | ICD-10-CM | POA: Diagnosis not present

## 2020-12-13 DIAGNOSIS — R4586 Emotional lability: Secondary | ICD-10-CM

## 2020-12-13 DIAGNOSIS — G4700 Insomnia, unspecified: Secondary | ICD-10-CM

## 2020-12-13 NOTE — Progress Notes (Signed)
I,April Miller,acting as a scribe for Lelon Huh, MD.,have documented all relevant documentation on the behalf of Lelon Huh, MD,as directed by  Lelon Huh, MD while in the presence of Lelon Huh, MD.   Established patient visit   Patient: Gilbert Lee   DOB: 1965-07-31   55 y.o. Male  MRN: [id number] Visit Date: 12/13/2020  Today's healthcare provider: Lelon Huh, MD   No chief complaint on file.  Subjective    HPI  Follow up for Hypogonadism in male:  The patient was last seen for this 4 months ago. Changes made at last visit include; rechecked labs 10/28/2020-Testosterone levels were still low. Increased gel to 3 pumps daily.  He reports good compliance with treatment. He feels that condition is: fatigue, fogginess, anxiety, insomnia. He is not having side effects. none  -----------------------------------------------------------------------------------------     Medications: Outpatient Medications Prior to Visit  Medication Sig   albuterol (VENTOLIN HFA) 108 (90 Base) MCG/ACT inhaler Inhale 1-2 puffs into the lungs every 6 (six) hours as needed for shortness of breath.   allopurinol (ZYLOPRIM) 100 MG tablet TAKE 1 TABLET BY MOUTH EVERY DAY WITH 300MG    allopurinol (ZYLOPRIM) 300 MG tablet Take 1 tablet (300 mg total) by mouth daily. Take with 100mg  tab for 400mg  total a day   Cholecalciferol (VITAMIN D3) 50 MCG (2000 UT) TABS Take 2,000 Units by mouth daily.    MELATONIN PO Take 3 mg by mouth at bedtime as needed (Sleep).    omeprazole (PRILOSEC OTC) 20 MG tablet Take 20 mg by mouth every morning.   rosuvastatin (CRESTOR) 20 MG tablet Take 1 tablet (20 mg total) by mouth daily.   Testosterone 20.25 MG/ACT (1.62%) GEL Place 3 Pump onto the skin daily.   zinc gluconate 50 MG tablet Take 50 mg by mouth daily.   No facility-administered medications prior to visit.    Review of Systems  Constitutional:  Negative for appetite change, chills and  fever.  Respiratory:  Negative for chest tightness, shortness of breath and wheezing.   Cardiovascular:  Negative for chest pain and palpitations.  Gastrointestinal:  Negative for abdominal pain, nausea and vomiting.      Objective    BP 125/79 (BP Location: Left Arm, Patient Position: Sitting, Cuff Size: Large)   Pulse 79   Resp 16   Wt 267 lb (121.1 kg)   SpO2 96%   BMI (P) 37.24 kg/m  {Show previous vital signs (optional):23777}  Physical Exam   General appearance: Mildly obese male, cooperative and in no acute distress Head: Normocephalic, without obvious abnormality, atraumatic Respiratory: Respirations even and unlabored, normal respiratory rate Extremities: All extremities are intact.  Skin: Skin color, texture, turgor normal. No rashes seen  Psych: Appropriate mood and affect. Neurologic: Mental status: Alert, oriented to person, place, and time, thought content appropriate.      Assessment & Plan     1. Hypogonadism in male Testosterone levels dropped on starting dose of gel, but levels were drawn in the afternoon whereas baseline levels were drawn in the morning. Will check follow up labs tomorrow early morning.   - Testosterone,Free and Total - TSH - T4, free - CBC - Comprehensive metabolic panel  2. Mood changes So far has not improved since starting testosterone replacement and seems to have gotten worse since increase dose.   - Testosterone,Free and Total - TSH - T4, free - CBC - Comprehensive metabolic panel  3. Need for shingles vaccine  - Administer  Zoster, Recombinant (Shingrix) Vaccine #2  4. Insomnia, unspecified type Checking labs as above.         The entirety of the information documented in the History of Present Illness, Review of Systems and Physical Exam were personally obtained by me. Portions of this information were initially documented by the CMA and reviewed by me for thoroughness and accuracy.     Lelon Huh, MD   [facility] [phone] (phone) [phone] (fax)  [place]

## 2020-12-14 DIAGNOSIS — R4586 Emotional lability: Secondary | ICD-10-CM | POA: Diagnosis not present

## 2020-12-14 DIAGNOSIS — E291 Testicular hypofunction: Secondary | ICD-10-CM | POA: Diagnosis not present

## 2020-12-17 LAB — COMPREHENSIVE METABOLIC PANEL
ALT: 30 IU/L (ref 0–44)
AST: 24 IU/L (ref 0–40)
Albumin/Globulin Ratio: 2 (ref 1.2–2.2)
Albumin: 4.5 g/dL (ref 3.8–4.9)
Alkaline Phosphatase: 56 IU/L (ref 44–121)
BUN/Creatinine Ratio: 16 (ref 9–20)
BUN: 16 mg/dL (ref 6–24)
Bilirubin Total: 0.5 mg/dL (ref 0.0–1.2)
CO2: 22 mmol/L (ref 20–29)
Calcium: 9.2 mg/dL (ref 8.7–10.2)
Chloride: 104 mmol/L (ref 96–106)
Creatinine, Ser: 0.98 mg/dL (ref 0.76–1.27)
Globulin, Total: 2.2 g/dL (ref 1.5–4.5)
Glucose: 120 mg/dL — ABNORMAL HIGH (ref 70–99)
Potassium: 4.4 mmol/L (ref 3.5–5.2)
Sodium: 141 mmol/L (ref 134–144)
Total Protein: 6.7 g/dL (ref 6.0–8.5)
eGFR: 90 mL/min/{1.73_m2} (ref >59)

## 2020-12-17 LAB — CBC
Hematocrit: 47 % (ref 37.5–51.0)
Hemoglobin: 15.4 g/dL (ref 13.0–17.7)
MCH: 29.5 pg (ref 26.6–33.0)
MCHC: 32.8 g/dL (ref 31.5–35.7)
MCV: 90 fL (ref 79–97)
Platelets: 195 10*3/uL (ref 150–450)
RBC: 5.22 x10E6/uL (ref 4.14–5.80)
RDW: 12.7 % (ref 11.6–15.4)
WBC: 8.7 10*3/uL (ref 3.4–10.8)

## 2020-12-17 LAB — TSH: TSH: 0.972 u[IU]/mL (ref 0.450–4.500)

## 2020-12-17 LAB — TESTOSTERONE,FREE AND TOTAL
Testosterone, Free: 4.1 pg/mL — ABNORMAL LOW (ref 7.2–24.0)
Testosterone: 205 ng/dL — ABNORMAL LOW (ref 264–916)

## 2020-12-17 LAB — T4, FREE: Free T4: 1.18 ng/dL (ref 0.82–1.77)

## 2020-12-20 ENCOUNTER — Encounter: Payer: Self-pay | Admitting: Family Medicine

## 2020-12-20 DIAGNOSIS — E291 Testicular hypofunction: Secondary | ICD-10-CM

## 2020-12-21 ENCOUNTER — Telehealth: Payer: Self-pay | Admitting: Family Medicine

## 2020-12-21 MED ORDER — "BD INSULIN SYRINGE 25G X 1"" 1 ML MISC": 0 refills | Status: AC

## 2020-12-21 MED ORDER — TESTOSTERONE CYPIONATE 100 MG/ML IJ SOLN: 1.0000 mL | INTRAMUSCULAR | 0 refills | Status: DC

## 2020-12-21 MED ORDER — ALBUTEROL SULFATE HFA 108 (90 BASE) MCG/ACT IN AERS: 1.0000 | INHALATION_SPRAY | Freq: Four times a day (QID) | RESPIRATORY_TRACT | 1 refills | Status: DC | PRN

## 2020-12-21 NOTE — Telephone Encounter (Signed)
I sent in prescription for patient to start testosterone injections to [facility]. He needs to come in for a nurse visit only sometime in the next week or so to instruct him how to do IM injections, then needs to schedule a follow up 6 weeks after starting injections.

## 2020-12-22 ENCOUNTER — Telehealth: Payer: Self-pay | Admitting: *Deleted

## 2020-12-22 NOTE — Telephone Encounter (Signed)
Tried calling patient. Left message to call back. OK for [facility] triage to advise and schedule Nurse visit appointment for patient education on self administration of Testosterone injections.

## 2020-12-22 NOTE — Telephone Encounter (Signed)
Scheduled nurse visit for injection demonstration for testosterone. Appointment for 01/05/21 @ 8:20am.

## 2020-12-29 ENCOUNTER — Other Ambulatory Visit: Payer: Self-pay

## 2020-12-29 ENCOUNTER — Ambulatory Visit: Payer: BC Managed Care – PPO

## 2020-12-29 DIAGNOSIS — E291 Testicular hypofunction: Secondary | ICD-10-CM

## 2020-12-29 NOTE — Progress Notes (Signed)
Instructed pt on how to inject Testerone IM.

## 2021-01-05 ENCOUNTER — Ambulatory Visit: Payer: BC Managed Care – PPO

## 2021-01-19 ENCOUNTER — Telehealth: Payer: Self-pay | Admitting: Family Medicine

## 2021-01-19 DIAGNOSIS — E291 Testicular hypofunction: Secondary | ICD-10-CM

## 2021-01-19 NOTE — Telephone Encounter (Signed)
Patient advised. Lab order placed up front for pick up.

## 2021-01-19 NOTE — Telephone Encounter (Signed)
Please advise is time to recheck testosterone levels. Have left order at lab.

## 2021-01-25 DIAGNOSIS — E291 Testicular hypofunction: Secondary | ICD-10-CM | POA: Diagnosis not present

## 2021-01-26 ENCOUNTER — Other Ambulatory Visit: Payer: Self-pay | Admitting: Family Medicine

## 2021-01-27 ENCOUNTER — Encounter: Payer: Self-pay | Admitting: Family Medicine

## 2021-01-27 DIAGNOSIS — E291 Testicular hypofunction: Secondary | ICD-10-CM

## 2021-01-27 LAB — CBC
Hematocrit: 43.7 % (ref 37.5–51.0)
Hemoglobin: 14.7 g/dL (ref 13.0–17.7)
MCH: 29.7 pg (ref 26.6–33.0)
MCHC: 33.6 g/dL (ref 31.5–35.7)
MCV: 88 fL (ref 79–97)
Platelets: 191 10*3/uL (ref 150–450)
RBC: 4.95 x10E6/uL (ref 4.14–5.80)
RDW: 13.1 % (ref 11.6–15.4)
WBC: 6 10*3/uL (ref 3.4–10.8)

## 2021-01-27 LAB — TESTOSTERONE,FREE AND TOTAL
Testosterone, Free: 1.8 pg/mL — ABNORMAL LOW (ref 7.2–24.0)
Testosterone: 62 ng/dL — ABNORMAL LOW (ref 264–916)

## 2021-02-08 MED ORDER — TESTOSTERONE CYPIONATE 100 MG/ML IJ SOLN: 1.0000 mL | INTRAMUSCULAR | Status: DC

## 2021-03-28 ENCOUNTER — Other Ambulatory Visit: Payer: Self-pay | Admitting: Family Medicine

## 2021-03-28 ENCOUNTER — Other Ambulatory Visit: Payer: Self-pay

## 2021-03-28 DIAGNOSIS — E291 Testicular hypofunction: Secondary | ICD-10-CM

## 2021-03-28 MED ORDER — TESTOSTERONE CYPIONATE 200 MG/ML IM SOLN
100.0000 mg | INTRAMUSCULAR | 0 refills | Status: DC
  Filled 2021-03-28: qty 4, 28d supply, fill #0
  Filled 2021-03-29: qty 2, 28d supply, fill #0

## 2021-03-28 NOTE — Telephone Encounter (Signed)
LOV: 12/13/2020  BZX:[id number]  Last Refill: 02/08/2021  Thanks,   -Mickel Baas

## 2021-03-29 ENCOUNTER — Other Ambulatory Visit: Payer: Self-pay

## 2021-03-29 ENCOUNTER — Other Ambulatory Visit: Payer: Self-pay | Admitting: Family Medicine

## 2021-03-29 DIAGNOSIS — E291 Testicular hypofunction: Secondary | ICD-10-CM

## 2021-03-29 MED ORDER — TESTOSTERONE CYPIONATE 200 MG/ML IM SOLN: 100.0000 mg | INTRAMUSCULAR | 0 refills | Status: DC

## 2021-03-29 NOTE — Telephone Encounter (Signed)
Medication Refill - Medication: testosterone cypionate (DEPOTESTOSTERONE CYPIONATE) 200 MG/ML injection  Has the patient contacted their pharmacy? No. Pt stated medication was sent to wrong pharmacy.   (Agent: If no, request that the patient contact the pharmacy for the refill. If patient does not wish to contact the pharmacy document the reason why and proceed with request.)   Preferred Pharmacy (with phone number or street name):   [facility] [id number], [place]  [place] [address]  Phone: [phone] Fax: [phone]  Hours: Not open 24 hours   Has the patient been seen for an appointment in the last year OR does the patient have an upcoming appointment? Yes.    Agent: Please be advised that RX refills may take up to 3 business days. We ask that you follow-up with your pharmacy.

## 2021-04-06 ENCOUNTER — Encounter: Payer: Self-pay | Admitting: Emergency Medicine

## 2021-04-06 ENCOUNTER — Ambulatory Visit (INDEPENDENT_AMBULATORY_CARE_PROVIDER_SITE_OTHER): Payer: BC Managed Care – PPO

## 2021-04-06 ENCOUNTER — Ambulatory Visit
Admission: EM | Admit: 2021-04-06 | Discharge: 2021-04-06 | Disposition: A | Discharge status: Home / Self Care | Payer: BC Managed Care – PPO | Attending: Medical Oncology | Admitting: Medical Oncology

## 2021-04-06 DIAGNOSIS — R0602 Shortness of breath: Secondary | ICD-10-CM

## 2021-04-06 DIAGNOSIS — R051 Acute cough: Secondary | ICD-10-CM | POA: Diagnosis not present

## 2021-04-06 DIAGNOSIS — R059 Cough, unspecified: Secondary | ICD-10-CM

## 2021-04-06 DIAGNOSIS — R509 Fever, unspecified: Secondary | ICD-10-CM

## 2021-04-06 DIAGNOSIS — R062 Wheezing: Secondary | ICD-10-CM | POA: Diagnosis not present

## 2021-04-06 LAB — POCT INFLUENZA A/B
Influenza A, POC: NEGATIVE
Influenza B, POC: NEGATIVE

## 2021-04-06 MED ORDER — BENZONATATE 100 MG PO CAPS: 100.0000 mg | ORAL_CAPSULE | Freq: Three times a day (TID) | ORAL | 0 refills | Status: DC

## 2021-04-06 MED ORDER — PREDNISONE 10 MG (21) PO TBPK: ORAL_TABLET | Freq: Every day | ORAL | 0 refills | Status: DC

## 2021-04-06 MED ORDER — ALBUTEROL SULFATE HFA 108 (90 BASE) MCG/ACT IN AERS: 1.0000 | INHALATION_SPRAY | Freq: Four times a day (QID) | RESPIRATORY_TRACT | 0 refills | Status: DC | PRN

## 2021-04-06 NOTE — ED Triage Notes (Signed)
Pt presents with cough, sinus pressure/pain, ST, and bodyaches x 4 days.

## 2021-04-06 NOTE — ED Provider Notes (Signed)
[name]    CSN: [id number] Arrival date & time: 04/06/21  1338      History   Chief Complaint Chief Complaint  Patient presents with   Cough   Sinus Pressure    Generalized Body Aches    HPI Gilbert Lee is a 56 y.o. male.   HPI  Cold Symptoms: Patient reports that they have had symptoms of dry and wet cough, sinus congestion, runny nose, SOB for the past 4 days. Symptoms are worsening and feel like bronchitis/asthma flare. They deny chest pain, hemoptysis or vomiting. They have tried alka seltzer, tylenol cold and flu for symptoms. Few various sic contacts. Multiple home COVID-19 tests have been negative.    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   Obesity (BMI 30-39.9) 03/28/2017   Hyperuricemia 02/14/2016   Allergic rhinitis 09/06/2015   Airway hyperreactivity 09/06/2015   Family history of malignant neoplasm of prostate 09/06/2015   Arthritis urica 09/06/2015   Hyperlipidemia 09/06/2015   Hemorrhoids, internal 09/06/2015    Past Surgical History:  Procedure Laterality Date   COLONOSCOPY WITH PROPOFOL N/A 11/11/2015   Procedure: COLONOSCOPY WITH PROPOFOL;  Surgeon: Lucilla Lame, MD;  Location: [place];  Service: Endoscopy;  Laterality: N/A;   ESOPHAGOGASTRODUODENOSCOPY (EGD) WITH PROPOFOL N/A 11/14/2018   Procedure: ESOPHAGOGASTRODUODENOSCOPY (EGD) WITH PROPOFOL;  Surgeon: Lin Landsman, MD;  Location: [facility] ENDOSCOPY;  Service: Gastroenterology;  Laterality: N/A;    ESOPHAGOGASTRODUODENOSCOPY (EGD) WITH PROPOFOL N/A 04/09/2019   Procedure: ESOPHAGOGASTRODUODENOSCOPY (EGD) WITH PROPOFOL;  Surgeon: Lin Landsman, MD;  Location: [facility] ENDOSCOPY;  Service: Gastroenterology;  Laterality: N/A;   KNEE ARTHROSCOPY Right    ORIF TOE FRACTURE Left 11/07/2019   Procedure: OPEN REDUCTION INTERNAL FIXATION (ORIF) METATARSAL (TOE) FRACTURE LEFT 5TH;  Surgeon: Samara Deist, DPM;  Location: [facility];  Service: Podiatry;  Laterality: Left;   POLYPECTOMY N/A 11/11/2015   Procedure: POLYPECTOMY;  Surgeon: Lucilla Lame, MD;  Location: [place];  Service: Endoscopy;  Laterality: N/A;       Home Medications    Prior to Admission medications   Medication Sig Start Date End Date Taking? Authorizing Provider  albuterol (VENTOLIN HFA) 108 (90 Base) MCG/ACT inhaler Inhale 1-2 puffs into the lungs every 6 (six) hours as needed for wheezing or shortness of breath. 04/06/21  Yes Cassandria Drew M, PA-C  allopurinol (ZYLOPRIM) 100 MG tablet TAKE 1 TABLET BY MOUTH EVERY DAY WITH 300MG  10/28/20  Yes Birdie Sons, MD  allopurinol (ZYLOPRIM) 300 MG tablet Take 1 tablet (300 mg total) by mouth daily. Take with 100mg  tab for 400mg  total a day 10/28/20  Yes Fisher, Kirstie Peri, MD  benzonatate (TESSALON) 100 MG capsule Take 1 capsule (100 mg total) by mouth every 8 (eight) hours. 04/06/21  Yes Teejay Meader M, PA-C  rosuvastatin (CRESTOR) 20 MG tablet Take 1 tablet (20 mg total) by mouth daily. 10/28/20  Yes Birdie Sons, MD  testosterone cypionate (DEPOTESTOSTERONE CYPIONATE) 200 MG/ML injection Inject  0.5 mLs (100 mg total) into the muscle every 7 (seven) days. 03/29/21  Yes Birdie Sons, MD  albuterol (VENTOLIN HFA) 108 (90 Base) MCG/ACT inhaler Inhale 1-2 puffs into the lungs every 6 (six) hours as needed for shortness of breath. 12/21/20   Birdie Sons, MD  Cholecalciferol (VITAMIN D3) 50 MCG (2000 UT) TABS Take 2,000 Units by mouth daily.     [provider]  Insulin Syringe-Needle U-100 (B-D INSULIN SYRINGE 1CC/25GX1") 25G X 1" 1 ML MISC Use once every 2 weeks for testosterone injection. 12/21/20   Birdie Sons, MD  MELATONIN PO Take 3 mg by mouth at bedtime as needed (Sleep).     [provider]  omeprazole (PRILOSEC OTC) 20 MG tablet Take 20 mg by mouth every morning.    [provider]  zinc gluconate 50 MG tablet Take 50 mg by mouth daily.    [provider]    Family History Family History  Problem Relation Age of Onset   Other Father        intestinal obstruction   Prostate cancer Father    Parkinsonism Mother    Deep vein thrombosis Brother    Deep vein thrombosis Brother     Social History Social History   Tobacco Use   Smoking status: Never   Smokeless tobacco: Never  Vaping Use   Vaping Use: Never used  Substance Use Topics   Alcohol use: Yes    Alcohol/week: 0.0 standard drinks    Comment: 2-3 weekly   Drug use: No     Allergies   Patient has no known allergies.   Review of Systems Review of Systems  As stated above in HPI Physical Exam Triage Vital Signs ED Triage Vitals  Enc Vitals Group     BP 04/06/21 1400 (!) 143/82     Pulse Rate 04/06/21 1400 (!) 107     Resp 04/06/21 1400 18     Temp 04/06/21 1400 99.8 F (37.7 C)     Temp Source 04/06/21 1400 Oral     SpO2 04/06/21 1400 95 %     Weight --      Height --      Head Circumference --      Peak Flow --      Pain Score 04/06/21 1359 0     Pain Loc --      Pain Edu? --      Excl. in [place]? --    No data found.  Updated Vital Signs BP (!) 143/82 (BP Location: Left Arm)    Pulse (!) 107    Temp 99.8 F (37.7 C) (Oral)    Resp 18    SpO2 95%   Physical Exam Vitals and nursing note reviewed.  Constitutional:      General: He is not in acute distress.    Appearance: Normal appearance. He is not ill-appearing, toxic-appearing or diaphoretic.  HENT:     Head: Normocephalic and atraumatic.     Right  Ear: Tympanic membrane normal.     Left Ear: Tympanic membrane normal.     Nose: Congestion and rhinorrhea present.     Mouth/Throat:     Mouth: Mucous membranes are moist.     Pharynx: Oropharynx is clear. No oropharyngeal exudate or posterior oropharyngeal erythema.  Eyes:     Extraocular Movements: Extraocular movements intact.     Pupils: Pupils are equal, round, and reactive to light.  Cardiovascular:  Rate and Rhythm: Normal rate and regular rhythm.     Heart sounds: Normal heart sounds.  Pulmonary:     Effort: Pulmonary effort is normal.     Breath sounds: Wheezing (few scattered in bilateral bases) present.  Musculoskeletal:     Cervical back: Normal range of motion and neck supple.  Lymphadenopathy:     Cervical: No cervical adenopathy.  Skin:    General: Skin is warm.  Neurological:     Mental Status: He is alert and oriented to person, place, and time.     UC Treatments / Results  Labs (all labs ordered are listed, but only abnormal results are displayed) Labs Reviewed  POCT INFLUENZA A/B    EKG   Radiology No results found.  Procedures Procedures (including critical care time)  Medications Ordered in UC Medications - No data to display  Initial Impression / Assessment and Plan / UC Course  I have reviewed the triage vital signs and the nursing notes.  Pertinent labs & imaging results that were available during my care of the patient were reviewed by me and considered in my medical decision making (see chart for details).     New. Chest x ray is negative for pneumonia or acute abnormality. Treating for bronchitis likely secondary to viral illness. Treating with albuterol, tessalon, prednisone, rest and hydration. Discussed red flag signs and symptoms. Follow up as needed.  Final Clinical Impressions(s) / UC Diagnoses   Final diagnoses:  Acute cough  Fever, unspecified   Discharge Instructions   None    ED Prescriptions     Medication Sig  Dispense Auth. Provider   albuterol (VENTOLIN HFA) 108 (90 Base) MCG/ACT inhaler Inhale 1-2 puffs into the lungs every 6 (six) hours as needed for wheezing or shortness of breath. 1 each Tyshawna Alarid M, PA-C   benzonatate (TESSALON) 100 MG capsule Take 1 capsule (100 mg total) by mouth every 8 (eight) hours. 21 capsule Hughie Closs, [place]      PDMP not reviewed this encounter.   Hughie Closs, [place] 04/06/21 1443

## 2021-04-22 ENCOUNTER — Other Ambulatory Visit: Payer: Self-pay

## 2021-04-22 ENCOUNTER — Ambulatory Visit
Admission: EM | Admit: 2021-04-22 | Discharge: 2021-04-22 | Disposition: A | Discharge status: Home / Self Care | Payer: BC Managed Care – PPO | Attending: Emergency Medicine | Admitting: Emergency Medicine

## 2021-04-22 DIAGNOSIS — J0110 Acute frontal sinusitis, unspecified: Secondary | ICD-10-CM

## 2021-04-22 DIAGNOSIS — R051 Acute cough: Secondary | ICD-10-CM

## 2021-04-22 DIAGNOSIS — R0602 Shortness of breath: Secondary | ICD-10-CM

## 2021-04-22 MED ORDER — SPIRIVA RESPIMAT 1.25 MCG/ACT IN AERS: 2.0000 | INHALATION_SPRAY | Freq: Every day | RESPIRATORY_TRACT | 1 refills | Status: DC

## 2021-04-22 MED ORDER — ALBUTEROL SULFATE HFA 108 (90 BASE) MCG/ACT IN AERS: 1.0000 | INHALATION_SPRAY | Freq: Four times a day (QID) | RESPIRATORY_TRACT | 1 refills | Status: DC | PRN

## 2021-04-22 MED ORDER — AZITHROMYCIN 250 MG PO TABS: 250.0000 mg | ORAL_TABLET | Freq: Every day | ORAL | 0 refills | Status: DC

## 2021-04-22 MED ORDER — FLUTICASONE PROPIONATE 50 MCG/ACT NA SUSP: 1.0000 | Freq: Every day | NASAL | 2 refills | Status: DC

## 2021-04-22 NOTE — ED Triage Notes (Signed)
Pt was dx with bronchitis a couple weeks ago.  Was feeling better but after completion of prednisone s/s have returned - sinus pressure/drainage, cough, rib/chest pain with coughing, scratchy throat, fatigue,  need for inhaler, etc.  States they are same s/s as he had before.  Would prefer not to take Prednisone again.

## 2021-04-22 NOTE — ED Provider Notes (Signed)
[name]    CSN: [id number] Arrival date & time: 04/22/21  1342      History   Chief Complaint Chief Complaint  Patient presents with   Cough    HPI Gilbert Lee is a 56 y.o. male.   Patient states that he has been ill with cough congestion facial pressure and shortness of breath for approximately 3 weeks.  Patient was seen here and given albuterol inhaler and prednisone pack which helped slightly.  Patient prefers not to go back on a prednisone pack.  Patient does have a history of asthma.  Denies any fevers no nausea vomiting or diarrhea.   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   Obesity (BMI 30-39.9) 03/28/2017   Hyperuricemia 02/14/2016   Allergic rhinitis 09/06/2015   Airway hyperreactivity 09/06/2015   Family history of malignant neoplasm of prostate 09/06/2015   Arthritis urica 09/06/2015   Hyperlipidemia 09/06/2015   Hemorrhoids, internal 09/06/2015    Past Surgical History:  Procedure Laterality Date   COLONOSCOPY WITH PROPOFOL N/A 11/11/2015   Procedure: COLONOSCOPY WITH PROPOFOL;  Surgeon: Lucilla Lame, MD;  Location: [place];  Service: Endoscopy;  Laterality: N/A;   ESOPHAGOGASTRODUODENOSCOPY (EGD) WITH PROPOFOL N/A 11/14/2018   Procedure: ESOPHAGOGASTRODUODENOSCOPY (EGD) WITH PROPOFOL;  Surgeon: Lin Landsman, MD;  Location: [facility] ENDOSCOPY;  Service: Gastroenterology;  Laterality: N/A;   ESOPHAGOGASTRODUODENOSCOPY (EGD) WITH PROPOFOL N/A 04/09/2019   Procedure:  ESOPHAGOGASTRODUODENOSCOPY (EGD) WITH PROPOFOL;  Surgeon: Lin Landsman, MD;  Location: [facility] ENDOSCOPY;  Service: Gastroenterology;  Laterality: N/A;   KNEE ARTHROSCOPY Right    ORIF TOE FRACTURE Left 11/07/2019   Procedure: OPEN REDUCTION INTERNAL FIXATION (ORIF) METATARSAL (TOE) FRACTURE LEFT 5TH;  Surgeon: Samara Deist, DPM;  Location: [facility];  Service: Podiatry;  Laterality: Left;   POLYPECTOMY N/A 11/11/2015   Procedure: POLYPECTOMY;  Surgeon: Lucilla Lame, MD;  Location: [place];  Service: Endoscopy;  Laterality: N/A;       Home Medications    Prior to Admission medications   Medication Sig Start Date End Date Taking? Authorizing Provider  albuterol (VENTOLIN HFA) 108 (90 Base) MCG/ACT inhaler Inhale 1-2 puffs into the lungs every 6 (six) hours as needed for wheezing or shortness of breath. 04/06/21  Yes C[place]ton, Sarah M, PA-C  allopurinol (ZYLOPRIM) 100 MG tablet TAKE 1 TABLET BY MOUTH EVERY DAY WITH 300MG  10/28/20  Yes Birdie Sons, MD  allopurinol (ZYLOPRIM) 300 MG tablet Take 1 tablet (300 mg total) by mouth daily. Take with 100mg  tab for 400mg  total a day 10/28/20  Yes Fisher, Kirstie Peri, MD  azithromycin (ZITHROMAX) 250 MG tablet Take 1 tablet (250 mg total) by mouth daily. Take first 2 tablets together, then 1 every day until finished. 04/22/21  Yes Marney Setting, NP  benzonatate (TESSALON) 100 MG capsule Take 1 capsule (100 mg total) by mouth every 8 (eight) hours. 04/06/21  Yes Covington, Sarah M, PA-C  Cholecalciferol (VITAMIN D3) 50 MCG (2000 UT) TABS  Take 2,000 Units by mouth daily.    Yes [provider]  fluticasone (FLONASE) 50 MCG/ACT nasal spray Place 1 spray into both nostrils daily. 04/22/21  Yes Marney Setting, NP  Insulin Syringe-Needle U-100 (B-D INSULIN SYRINGE 1CC/25GX1") 25G X 1" 1 ML MISC Use once every 2 weeks for testosterone injection. 12/21/20  Yes Birdie Sons, MD  predniSONE (STERAPRED UNI-PAK 21 TAB) 10 MG (21) TBPK  tablet Take by mouth daily. Take 6 tabs by mouth daily  for 2 days, then 5 tabs for 2 days, then 4 tabs for 2 days, then 3 tabs for 2 days, 2 tabs for 2 days, then 1 tab by mouth daily for 2 days 04/06/21  Yes Covington, Sarah M, PA-C  rosuvastatin (CRESTOR) 20 MG tablet Take 1 tablet (20 mg total) by mouth daily. 10/28/20  Yes Birdie Sons, MD  testosterone cypionate (DEPOTESTOSTERONE CYPIONATE) 200 MG/ML injection Inject 0.5 mLs (100 mg total) into the muscle every 7 (seven) days. 03/29/21  Yes Birdie Sons, MD  Tiotropium Bromide Monohydrate (SPIRIVA RESPIMAT) 1.25 MCG/ACT AERS Inhale 2 puffs into the lungs daily. 04/22/21  Yes Marney Setting, NP  zinc gluconate 50 MG tablet Take 50 mg by mouth daily.   Yes [provider]  albuterol (VENTOLIN HFA) 108 (90 Base) MCG/ACT inhaler Inhale 1-2 puffs into the lungs every 6 (six) hours as needed for shortness of breath. 04/22/21   Marney Setting, NP  MELATONIN PO Take 3 mg by mouth at bedtime as needed (Sleep).     [provider]  omeprazole (PRILOSEC OTC) 20 MG tablet Take 20 mg by mouth every morning.    [provider]    Family History Family History  Problem Relation Age of Onset   Other Father        intestinal obstruction   Prostate cancer Father    Parkinsonism Mother    Deep vein thrombosis Brother    Deep vein thrombosis Brother     Social History Social History   Tobacco Use   Smoking status: Never   Smokeless tobacco: Never  Vaping Use   Vaping Use: Never used  Substance Use Topics   Alcohol use: Yes    Alcohol/week: 0.0 standard drinks    Comment: 2-3 weekly   Drug use: No     Allergies   Patient has no known allergies.   Review of Systems Review of Systems  Constitutional:  Negative for fever.  HENT:  Positive for congestion, sinus pressure, sinus pain and sneezing.   Respiratory:  Positive for cough, shortness of breath and wheezing.   Gastrointestinal: Negative.    Genitourinary: Negative.   Neurological: Negative.     Physical Exam Triage Vital Signs ED Triage Vitals  Enc Vitals Group     BP 04/22/21 1411 (!) 145/85     Pulse Rate 04/22/21 1411 (!) 108     Resp 04/22/21 1411 20     Temp 04/22/21 1411 99.6 F (37.6 C)     Temp Source 04/22/21 1411 Oral     SpO2 04/22/21 1411 95 %     Weight --      Height --      Head Circumference --      Peak Flow --      Pain Score 04/22/21 1402 0     Pain Loc --      Pain Edu? --      Excl. in [place]? --  No data found.  Updated Vital Signs BP (!) 145/85 (BP Location: Left Arm)    Pulse (!) 108    Temp 99.6 F (37.6 C) (Oral)    Resp 20    SpO2 95%   Visual Acuity Right Eye Distance:   Left Eye Distance:   Bilateral Distance:    Right Eye Near:   Left Eye Near:    Bilateral Near:     Physical Exam Constitutional:      Appearance: Normal appearance.  HENT:     Right Ear: Tympanic membrane normal.     Left Ear: Tympanic membrane normal.     Nose: Congestion present.  Eyes:     Pupils: Pupils are equal, round, and reactive to light.  Cardiovascular:     Rate and Rhythm: Tachycardia present.  Pulmonary:     Breath sounds: Wheezing present.  Abdominal:     General: Abdomen is flat.  Skin:    General: Skin is warm.     Capillary Refill: Capillary refill takes less than 2 seconds.  Neurological:     General: No focal deficit present.     Mental Status: He is alert.     UC Treatments / Results  Labs (all labs ordered are listed, but only abnormal results are displayed) Labs Reviewed - No data to display  EKG   Radiology No results found.  Procedures Procedures (including critical care time)  Medications Ordered in UC Medications - No data to display  Initial Impression / Assessment and Plan / UC Course  I have reviewed the triage vital signs and the nursing notes.  Pertinent labs & imaging results that were available during my care of the patient were reviewed by me  and considered in my medical decision making (see chart for details).     Patient will use albuterol inhaler as needed for cough or shortness of breath We will start on low-dose Spiriva patient should take a weekly to see if this helps with the shortness of breath and cough and wheezing If symptoms become worse patient will need a chest x-ray to rule out any further complications Use humidifier or inhaler as needed  Final Clinical Impressions(s) / UC Diagnoses   Final diagnoses:  Acute cough  Shortness of breath  Acute non-recurrent frontal sinusitis   Discharge Instructions   None    ED Prescriptions     Medication Sig Dispense Auth. Provider   albuterol (VENTOLIN HFA) 108 (90 Base) MCG/ACT inhaler Inhale 1-2 puffs into the lungs every 6 (six) hours as needed for shortness of breath. 18 g Marney Setting, NP   Tiotropium Bromide Monohydrate (SPIRIVA RESPIMAT) 1.25 MCG/ACT AERS Inhale 2 puffs into the lungs daily. 4 g Marney Setting, NP   azithromycin (ZITHROMAX) 250 MG tablet Take 1 tablet (250 mg total) by mouth daily. Take first 2 tablets together, then 1 every day until finished. 6 tablet Morley Kos L, NP   fluticasone (FLONASE) 50 MCG/ACT nasal spray Place 1 spray into both nostrils daily. 16 g Marney Setting, NP      PDMP not reviewed this encounter.   Marney Setting, NP 04/22/21 [phone]

## 2021-04-26 ENCOUNTER — Encounter: Payer: Self-pay | Admitting: Family Medicine

## 2021-04-26 ENCOUNTER — Other Ambulatory Visit: Payer: Self-pay | Admitting: Family Medicine

## 2021-04-26 DIAGNOSIS — E291 Testicular hypofunction: Secondary | ICD-10-CM

## 2021-04-29 DIAGNOSIS — E291 Testicular hypofunction: Secondary | ICD-10-CM | POA: Diagnosis not present

## 2021-05-06 LAB — HEMOGLOBIN AND HEMATOCRIT, BLOOD
Hematocrit: 45.1 % (ref 37.5–51.0)
Hemoglobin: 14.9 g/dL (ref 13.0–17.7)

## 2021-05-06 LAB — PSA TOTAL (REFLEX TO FREE): Prostate Specific Ag, Serum: 1.5 ng/mL (ref 0.0–4.0)

## 2021-05-06 LAB — TESTOSTERONE,FREE AND TOTAL
Testosterone, Free: 15.3 pg/mL (ref 7.2–24.0)
Testosterone: 722 ng/dL (ref 264–916)

## 2021-06-01 ENCOUNTER — Encounter: Payer: Self-pay | Admitting: Family Medicine

## 2021-06-02 ENCOUNTER — Telehealth: Payer: Self-pay

## 2021-06-02 NOTE — Telephone Encounter (Signed)
CALLED PATIENT NO ANSWER LEFT VOICEMAIL FOR A CALL BACK ? ?

## 2021-06-02 NOTE — Telephone Encounter (Signed)
Last colonoscopy was 11/11/2015, pathology report showed tubular adenoma in transverse colon and hyperplastic polyp in sigmoid colon. GI recommend repeating in 5 years. Please advise if ok to order Colonoscopy referral. Order is pending in this encounter.  ?

## 2021-06-03 ENCOUNTER — Other Ambulatory Visit: Payer: Self-pay

## 2021-06-03 DIAGNOSIS — Z86010 Personal history of colonic polyps: Secondary | ICD-10-CM

## 2021-06-03 MED ORDER — PEG 3350-KCL-NA BICARB-NACL 420 G PO SOLR: 4000.0000 mL | Freq: Once | ORAL | 0 refills | Status: AC

## 2021-06-03 NOTE — Progress Notes (Signed)
Gastroenterology Pre-Procedure Review ? ?Request Date: 07/15/2021 ?Requesting Physician: Dr. Marius Ditch  ? ?PATIENT REVIEW QUESTIONS: The patient responded to the following health history questions as indicated:   ? ?1. Are you having any GI issues? no ?2. Do you have a personal history of Polyps? yes (last colonoscopy ) ?3. Do you have a family history of Colon Cancer or Polyps? no ?4. Diabetes Mellitus? no ?5. Joint replacements in the past 12 months?no ?6. Major health problems in the past 3 months?no ?7. Any artificial heart valves, MVP, or defibrillator?no ?   ?MEDICATIONS & ALLERGIES:    ?Patient reports the following regarding taking any anticoagulation/antiplatelet therapy:   ?Plavix, Coumadin, Eliquis, Xarelto, Lovenox, Pradaxa, Brilinta, or Effient? no ?Aspirin? no ? ?Patient confirms/reports the following medications:  ?Current Outpatient Medications  ?Medication Sig Dispense Refill  ? albuterol (VENTOLIN HFA) 108 (90 Base) MCG/ACT inhaler Inhale 1-2 puffs into the lungs every 6 (six) hours as needed for shortness of breath. 18 g 1  ? allopurinol (ZYLOPRIM) 100 MG tablet TAKE 1 TABLET BY MOUTH EVERY DAY WITH '300MG'$  90 tablet 4  ? allopurinol (ZYLOPRIM) 300 MG tablet Take 1 tablet (300 mg total) by mouth daily. Take with '100mg'$  tab for '400mg'$  total a day 90 tablet 4  ? azithromycin (ZITHROMAX) 250 MG tablet Take 1 tablet (250 mg total) by mouth daily. Take first 2 tablets together, then 1 every day until finished. 6 tablet 0  ? benzonatate (TESSALON) 100 MG capsule Take 1 capsule (100 mg total) by mouth every 8 (eight) hours. 21 capsule 0  ? Cholecalciferol (VITAMIN D3) 50 MCG (2000 UT) TABS Take 2,000 Units by mouth daily.     ? fluticasone (FLONASE) 50 MCG/ACT nasal spray Place 1 spray into both nostrils daily. 16 g 2  ? Insulin Syringe-Needle U-100 (B-D INSULIN SYRINGE 1CC/25GX1") 25G X 1" 1 ML MISC Use once every 2 weeks for testosterone injection. 50 each 0  ? MELATONIN PO Take 3 mg by mouth at bedtime as  needed (Sleep).     ? omeprazole (PRILOSEC OTC) 20 MG tablet Take 20 mg by mouth every morning.    ? predniSONE (STERAPRED UNI-PAK 21 TAB) 10 MG (21) TBPK tablet Take by mouth daily. Take 6 tabs by mouth daily  for 2 days, then 5 tabs for 2 days, then 4 tabs for 2 days, then 3 tabs for 2 days, 2 tabs for 2 days, then 1 tab by mouth daily for 2 days 42 tablet 0  ? rosuvastatin (CRESTOR) 20 MG tablet Take 1 tablet (20 mg total) by mouth daily. 90 tablet 4  ? testosterone cypionate (DEPOTESTOSTERONE CYPIONATE) 200 MG/ML injection Inject 0.5 mLs (100 mg total) into the muscle every 7 (seven) days. 10 mL 0  ? Tiotropium Bromide Monohydrate (SPIRIVA RESPIMAT) 1.25 MCG/ACT AERS Inhale 2 puffs into the lungs daily. 4 g 1  ? zinc gluconate 50 MG tablet Take 50 mg by mouth daily.    ? ?No current facility-administered medications for this visit.  ? ? ?Patient confirms/reports the following allergies:  ?No Known Allergies ? ?No orders of the defined types were placed in this encounter. ? ? ?AUTHORIZATION INFORMATION ?Primary Insurance: ?1D#: ?Group #: ? ?Secondary Insurance: ?1D#: ?Group #: ? ?SCHEDULE INFORMATION: ?Date: 07/15/2021 ?Time: ?Location:[facility] ? ?

## 2021-07-14 ENCOUNTER — Encounter: Payer: Self-pay | Admitting: Gastroenterology

## 2021-07-15 ENCOUNTER — Ambulatory Visit
Admission: RE | Admit: 2021-07-15 | Discharge: 2021-07-15 | Disposition: A | Discharge status: Home / Self Care | Payer: BC Managed Care – PPO | Attending: Gastroenterology | Admitting: Gastroenterology

## 2021-07-15 ENCOUNTER — Ambulatory Visit: Payer: BC Managed Care – PPO | Admitting: Certified Registered"

## 2021-07-15 ENCOUNTER — Encounter
Admission: RE | Disposition: A | Discharge status: Home / Self Care | Payer: Self-pay | Source: Home / Self Care | Attending: Gastroenterology

## 2021-07-15 DIAGNOSIS — J45909 Unspecified asthma, uncomplicated: Secondary | ICD-10-CM | POA: Diagnosis not present

## 2021-07-15 DIAGNOSIS — Z1211 Encounter for screening for malignant neoplasm of colon: Secondary | ICD-10-CM | POA: Diagnosis not present

## 2021-07-15 DIAGNOSIS — K635 Polyp of colon: Secondary | ICD-10-CM | POA: Diagnosis not present

## 2021-07-15 DIAGNOSIS — K219 Gastro-esophageal reflux disease without esophagitis: Secondary | ICD-10-CM | POA: Diagnosis not present

## 2021-07-15 DIAGNOSIS — M109 Gout, unspecified: Secondary | ICD-10-CM | POA: Diagnosis not present

## 2021-07-15 DIAGNOSIS — Z86010 Personal history of colonic polyps: Secondary | ICD-10-CM | POA: Insufficient documentation

## 2021-07-15 HISTORY — PX: COLONOSCOPY WITH PROPOFOL: SHX5780

## 2021-07-15 SURGERY — COLONOSCOPY WITH PROPOFOL
Anesthesia: General

## 2021-07-15 MED ORDER — STERILE WATER FOR IRRIGATION IR SOLN
Status: DC | PRN
  Administered 2021-07-15: 360 mL

## 2021-07-15 MED ORDER — LIDOCAINE HCL (PF) 2 % IJ SOLN
INTRAMUSCULAR | Status: AC
  Filled 2021-07-15: qty 5

## 2021-07-15 MED ORDER — PROPOFOL 10 MG/ML IV BOLUS
INTRAVENOUS | Status: DC | PRN
  Administered 2021-07-15: 100 mg via INTRAVENOUS
  Administered 2021-07-15: 160 ug/kg/min via INTRAVENOUS

## 2021-07-15 MED ORDER — SODIUM CHLORIDE 0.9 % IV SOLN
INTRAVENOUS | Status: DC
  Administered 2021-07-15: 20 mL/h via INTRAVENOUS

## 2021-07-15 MED ORDER — PROPOFOL 10 MG/ML IV BOLUS
INTRAVENOUS | Status: AC
  Filled 2021-07-15: qty 40

## 2021-07-15 MED ORDER — LIDOCAINE HCL (CARDIAC) PF 100 MG/5ML IV SOSY
PREFILLED_SYRINGE | INTRAVENOUS | Status: DC | PRN
  Administered 2021-07-15: 100 mg via INTRAVENOUS

## 2021-07-15 NOTE — Transfer of Care (Signed)
Immediate Anesthesia Transfer of Care Note ? ?Patient: Gilbert Lee ? ?Procedure(s) Performed: COLONOSCOPY WITH PROPOFOL ? ?Patient Location: PACU ? ?Anesthesia Type:General ? ?Level of Consciousness: awake ? ?Airway & Oxygen Therapy: Patient Spontanous Breathing ? ?Post-op Assessment: Report given to RN and Post -op Vital signs reviewed and stable ? ?Post vital signs: Reviewed and stable ? ?Last Vitals:  ?Vitals Value Taken Time  ?BP 116/66 07/15/21 1005  ?Temp 36.1 ?C 07/15/21 1004  ?Pulse 86 07/15/21 1004  ?Resp 18 07/15/21 1007  ?SpO2 96 % 07/15/21 1004  ?Vitals shown include unvalidated device data. ? ?Last Pain:  ?Vitals:  ? 07/15/21 1004  ?TempSrc: Temporal  ?PainSc: 0-No pain  ?   ? ?  ? ?Complications: No notable events documented. ?

## 2021-07-15 NOTE — H&P (Signed)
?Gilbert Darby, MD ?[address]  ?[address]  ?[place], [place] [address]  ?Main: [phone]  ?Fax: [phone] ?Pager: [phone] ? ?Primary Care Physician:  Birdie Sons, MD ?Primary Gastroenterologist:  Dr. Cephas Lee ? ?Pre-Procedure History & Physical: ?HPI:  Gilbert Lee is a 56 y.o. male is here for an colonoscopy. ?  ?Past Medical History:  ?Diagnosis Date  ? Arthritis   ? Asthma   ? well controlled  ? Esophageal stricture   ? GERD (gastroesophageal reflux disease)   ? Gout   ? History of hiatal hernia   ? ? ?Past Surgical History:  ?Procedure Laterality Date  ? COLONOSCOPY WITH PROPOFOL N/A 11/11/2015  ? Procedure: COLONOSCOPY WITH PROPOFOL;  Surgeon: Lucilla Lame, MD;  Location: [facility];  Service: Endoscopy;  Laterality: N/A;  ? ESOPHAGOGASTRODUODENOSCOPY (EGD) WITH PROPOFOL N/A 11/14/2018  ? Procedure: ESOPHAGOGASTRODUODENOSCOPY (EGD) WITH PROPOFOL;  Surgeon: Lin Landsman, MD;  Location: [facility] ENDOSCOPY;  Service: Gastroenterology;  Laterality: N/A;  ? ESOPHAGOGASTRODUODENOSCOPY (EGD) WITH PROPOFOL N/A 04/09/2019  ? Procedure: ESOPHAGOGASTRODUODENOSCOPY (EGD) WITH PROPOFOL;  Surgeon: Lin Landsman, MD;  Location: [facility] ENDOSCOPY;  Service: Gastroenterology;  Laterality: N/A;  ? KNEE ARTHROSCOPY Right   ? ORIF TOE FRACTURE Left 11/07/2019  ? Procedure: OPEN REDUCTION INTERNAL FIXATION (ORIF) METATARSAL (TOE) FRACTURE LEFT 5TH;  Surgeon: Samara Deist, DPM;  Location: [facility] ORS;  Service: Podiatry;  Laterality: Left;  ? POLYPECTOMY N/A 11/11/2015  ? Procedure: POLYPECTOMY;  Surgeon: Lucilla Lame, MD;  Location: [facility];  Service: Endoscopy;  Laterality: N/A;  ? ? ?Prior to Admission medications   ?Medication Sig Start Date End Date Taking? Authorizing Provider  ?albuterol (VENTOLIN HFA) 108 (90 Base) MCG/ACT inhaler Inhale 1-2 puffs into the lungs every 6 (six) hours as needed for shortness of breath. 04/22/21  Yes Marney Setting, NP  ?allopurinol  (ZYLOPRIM) 100 MG tablet TAKE 1 TABLET BY MOUTH EVERY DAY WITH '300MG'$  10/28/20  Yes Birdie Sons, MD  ?allopurinol (ZYLOPRIM) 300 MG tablet Take 1 tablet (300 mg total) by mouth daily. Take with '100mg'$  tab for '400mg'$  total a day 10/28/20  Yes Fisher, Kirstie Peri, MD  ?azithromycin (ZITHROMAX) 250 MG tablet Take 1 tablet (250 mg total) by mouth daily. Take first 2 tablets together, then 1 every day until finished. 04/22/21  Yes Marney Setting, NP  ?benzonatate (TESSALON) 100 MG capsule Take 1 capsule (100 mg total) by mouth every 8 (eight) hours. 04/06/21  Yes Hughie Closs, PA-C  ?Cholecalciferol (VITAMIN D3) 50 MCG (2000 UT) TABS Take 2,000 Units by mouth daily.    Yes [provider]  ?fluticasone (FLONASE) 50 MCG/ACT nasal spray Place 1 spray into both nostrils daily. 04/22/21  Yes Marney Setting, NP  ?Insulin Syringe-Needle U-100 (B-D INSULIN SYRINGE 1CC/25GX1") 25G X 1" 1 ML MISC Use once every 2 weeks for testosterone injection. 12/21/20  Yes Birdie Sons, MD  ?MELATONIN PO Take 3 mg by mouth at bedtime as needed (Sleep).    Yes [provider]  ?omeprazole (PRILOSEC OTC) 20 MG tablet Take 20 mg by mouth every morning.   Yes [provider]  ?predniSONE (STERAPRED UNI-PAK 21 TAB) 10 MG (21) TBPK tablet Take by mouth daily. Take 6 tabs by mouth daily  for 2 days, then 5 tabs for 2 days, then 4 tabs for 2 days, then 3 tabs for 2 days, 2 tabs for 2 days, then 1 tab by mouth daily for 2 days  04/06/21  Yes Hughie Closs, PA-C  ?rosuvastatin (CRESTOR) 20 MG tablet Take 1 tablet (20 mg total) by mouth daily. 10/28/20  Yes Birdie Sons, MD  ?testosterone cypionate (DEPOTESTOSTERONE CYPIONATE) 200 MG/ML injection Inject 0.5 mLs (100 mg total) into the muscle every 7 (seven) days. 03/29/21  Yes Birdie Sons, MD  ?Tiotropium Bromide Monohydrate (SPIRIVA RESPIMAT) 1.25 MCG/ACT AERS Inhale 2 puffs into the lungs daily. 04/22/21  Yes Marney Setting, NP  ?zinc gluconate 50 MG  tablet Take 50 mg by mouth daily.   Yes [provider]  ? ? ?Allergies as of 06/03/2021  ? (No Known Allergies)  ? ? ?Family History  ?Problem Relation Age of Onset  ? Other Father   ?     intestinal obstruction  ? Prostate cancer Father   ? Parkinsonism Mother   ? Deep vein thrombosis Brother   ? Deep vein thrombosis Brother   ? ? ?Social History  ? ?Socioeconomic History  ? Marital status: Married  ?  Spouse name: Not on file  ? Number of children: Not on file  ? Years of education: Not on file  ? Highest education level: Not on file  ?Occupational History  ? Not on file  ?Tobacco Use  ? Smoking status: Never  ? Smokeless tobacco: Never  ?Vaping Use  ? Vaping Use: Never used  ?Substance and Sexual Activity  ? Alcohol use: Yes  ?  Alcohol/week: 0.0 standard drinks  ?  Comment: 2-3 weekly  ? Drug use: No  ? Sexual activity: Not on file  ?Other Topics Concern  ? Not on file  ?Social History Narrative  ? Not on file  ? ?Social Determinants of Health  ? ?Financial Resource Strain: Not on file  ?Food Insecurity: Not on file  ?Transportation Needs: Not on file  ?Physical Activity: Not on file  ?Stress: Not on file  ?Social Connections: Not on file  ?Intimate Partner Violence: Not on file  ? ? ?Review of Systems: ?See HPI, otherwise negative ROS ? ?Physical Exam: ?BP (!) 128/95   Pulse 85   Temp (!) 97.2 ?F (36.2 ?C) (Temporal)   Resp 20   Ht 6' (1.829 m)   Wt 120.2 kg   SpO2 97%   BMI 35.94 kg/m?  ?General:   Alert,  pleasant and cooperative in NAD ?Head:  Normocephalic and atraumatic. ?Neck:  Supple; no masses or thyromegaly. ?Lungs:  Clear throughout to auscultation.    ?Heart:  Regular rate and rhythm. ?Abdomen:  Soft, nontender and nondistended. Normal bowel sounds, without guarding, and without rebound.   ?Neurologic:  Alert and  oriented x4;  grossly normal neurologically. ? ?Impression/Plan: ?Gilbert Lee is here for an colonoscopy to be performed for h/o colon adenomas ? ?Risks, benefits,  limitations, and alternatives regarding  colonoscopy have been reviewed with the patient.  Questions have been answered.  All parties agreeable. ? ? ?Sherri Sear, MD  07/15/2021, 9:30 AM ?

## 2021-07-15 NOTE — Anesthesia Preprocedure Evaluation (Signed)
Anesthesia Evaluation  ?Patient identified by MRN, date of birth, ID band ?Patient awake ? ? ? ?Reviewed: ?Allergy & Precautions, NPO status , Patient's Chart, lab work & pertinent test results ? ?History of Anesthesia Complications ?Negative for: history of anesthetic complications ? ?Airway ?Mallampati: III ? ?TM Distance: >3 FB ?Neck ROM: Full ? ? ? Dental ?no notable dental hx. ? ?  ?Pulmonary ?asthma (mild intermittent) , neg sleep apnea,  ?  ?breath sounds clear to auscultation- rhonchi ?(-) wheezing ? ? ? ? ? Cardiovascular ?Exercise Tolerance: Good ?(-) hypertension(-) CAD, (-) Past MI, (-) Cardiac Stents and (-) CABG  ?Rhythm:Regular Rate:Normal ?- Systolic murmurs and - Diastolic murmurs ? ?  ?Neuro/Psych ?neg Seizures negative neurological ROS ? negative psych ROS  ? GI/Hepatic ?Neg liver ROS, hiatal hernia, GERD  Controlled and Medicated,  ?Endo/Other  ?neg diabetes ? Renal/GU ?negative Renal ROS  ? ?  ?Musculoskeletal ? ?(+) Arthritis ,  ? Abdominal ?(+) + obese,   ?Peds ? Hematology ?negative hematology ROS ?(+)   ?Anesthesia Other Findings ?Past Medical History: ?No date: Arthritis ?No date: Asthma ?    Comment:  well controlled ?No date: GERD (gastroesophageal reflux disease) ?No date: Gout ?No date: History of hiatal hernia ? ? Reproductive/Obstetrics ? ?  ? ? ? ? ? ? ? ? ? ? ? ? ? ?  ?  ? ? ? ? ? ? ? ? ?Anesthesia Physical ? ?Anesthesia Plan ? ?ASA: II ? ?Anesthesia Plan: General  ? ?Post-op Pain Management:   ? ?Induction: Intravenous ? ?PONV Risk Score and Plan: 1 ? ?Airway Management Planned: Natural Airway ? ?Additional Equipment:  ? ?Intra-op Plan:  ? ?Post-operative Plan:  ? ?Informed Consent: I have reviewed the patients History and Physical, chart, labs and discussed the procedure including the risks, benefits and alternatives for the proposed anesthesia with the patient or authorized representative who has indicated his/her understanding and acceptance.   ? ? ? ?Dental advisory given ? ?Plan Discussed with: CRNA and Anesthesiologist ? ?Anesthesia Plan Comments:   ? ? ? ? ? ? ?Anesthesia Quick Evaluation ? ?

## 2021-07-15 NOTE — Anesthesia Postprocedure Evaluation (Signed)
Anesthesia Post Note ? ?Patient: Gilbert Lee ? ?Procedure(s) Performed: COLONOSCOPY WITH PROPOFOL ? ?Patient location during evaluation: Endoscopy ?Anesthesia Type: General ?Level of consciousness: awake and alert ?Pain management: pain level controlled ?Vital Signs Assessment: post-procedure vital signs reviewed and stable ?Respiratory status: spontaneous breathing, nonlabored ventilation and respiratory function stable ?Cardiovascular status: blood pressure returned to baseline and stable ?Postop Assessment: no apparent nausea or vomiting ?Anesthetic complications: no ? ? ?No notable events documented. ? ? ?Last Vitals:  ?Vitals:  ? 07/15/21 1014 07/15/21 1024  ?BP:    ?Pulse: 75 71  ?Resp:    ?Temp:    ?SpO2: 99% 99%  ?  ?Last Pain:  ?Vitals:  ? 07/15/21 1024  ?TempSrc:   ?PainSc: 0-No pain  ? ? ?  ?  ?  ?  ?  ?  ? ?[place] Ouch ? ? ? ? ?

## 2021-07-15 NOTE — Op Note (Signed)
[facility] ?Gastroenterology ?Patient Name: Gilbert Lee ?Procedure Date: 07/15/2021 9:30 AM ?MRN: [id number] ?Account #: [id number] ?Date of Birth: 01-13-1966 ?Admit Type: Outpatient ?Age: 56 ?Room: [facility] ENDO ROOM 4 ?Gender: Male ?Note Status: Finalized ?Instrument Name: Colonscope [id number] ?Procedure:             Colonoscopy ?Indications:           Surveillance: Personal history of adenomatous polyps  ?                       on last colonoscopy 5 years ago, Last colonoscopy:  ?                       August 2017 ?Providers:             Lin Landsman MD, MD ?Referring MD:          Kirstie Peri. Caryn Section, MD (Referring MD) ?Medicines:             General Anesthesia ?Complications:         No immediate complications. Estimated blood loss: None. ?Procedure:             Pre-Anesthesia Assessment: ?                       - Prior to the procedure, a History and Physical was  ?                       performed, and patient medications and allergies were  ?                       reviewed. The patient is competent. The risks and  ?                       benefits of the procedure and the sedation options and  ?                       risks were discussed with the patient. All questions  ?                       were answered and informed consent was obtained.  ?                       Patient identification and proposed procedure were  ?                       verified by the physician, the nurse, the  ?                       anesthesiologist, the anesthetist and the technician  ?                       in the pre-procedure area in the procedure room in the  ?                       endoscopy suite. Mental Status Examination: alert and  ?                       oriented. Airway Examination: normal oropharyngeal  ?  airway and neck mobility. Respiratory Examination:  ?                       clear to auscultation. CV Examination: normal.  ?                       Prophylactic Antibiotics: The  patient does not require  ?                       prophylactic antibiotics. Prior Anticoagulants: The  ?                       patient has taken no previous anticoagulant or  ?                       antiplatelet agents. ASA Grade Assessment: II - A  ?                       patient with mild systemic disease. After reviewing  ?                       the risks and benefits, the patient was deemed in  ?                       satisfactory condition to undergo the procedure. The  ?                       anesthesia plan was to use general anesthesia.  ?                       Immediately prior to administration of medications,  ?                       the patient was re-assessed for adequacy to receive  ?                       sedatives. The heart rate, respiratory rate, oxygen  ?                       saturations, blood pressure, adequacy of pulmonary  ?                       ventilation, and response to care were monitored  ?                       throughout the procedure. The physical status of the  ?                       patient was re-assessed after the procedure. ?                       After obtaining informed consent, the colonoscope was  ?                       passed under direct vision. Throughout the procedure,  ?                       the patient's blood pressure, pulse, and oxygen  ?  saturations were monitored continuously. The  ?                       Colonoscope was introduced through the anus and  ?                       advanced to the the cecum, identified by appendiceal  ?                       orifice and ileocecal valve. The colonoscopy was  ?                       performed without difficulty. The patient tolerated  ?                       the procedure well. The quality of the bowel  ?                       preparation was evaluated using the BBPS [facility] Bowel  ?                       Preparation Scale) with scores of: Right Colon = 3,  ?                       Transverse  Colon = 3 and Left Colon = 3 (entire mucosa  ?                       seen well with no residual staining, small fragments  ?                       of stool or opaque liquid). The total BBPS score  ?                       equals 9. ?Findings: ?     The perianal and digital rectal examinations were normal. Pertinent  ?     negatives include normal sphincter tone and no palpable rectal lesions. ?     A 5 mm polyp was found in the sigmoid colon. The polyp was sessile. The  ?     polyp was removed with a cold snare. Resection and retrieval were  ?     complete. ?     The retroflexed view of the distal rectum and anal verge was normal and  ?     showed no anal or rectal abnormalities. ?     The exam was otherwise without abnormality. ?Impression:            - One 5 mm polyp in the sigmoid colon, removed with a  ?                       cold snare. Resected and retrieved. ?                       - The distal rectum and anal verge are normal on  ?                       retroflexion view. ?                       -  The examination was otherwise normal. ?Recommendation:        - Discharge patient to home (with escort). ?                       - Resume previous diet today. ?                       - Continue present medications. ?                       - Await pathology results. ?                       - Repeat colonoscopy in 7-10 years for surveillance  ?                       based on pathology results. ?Procedure Code(s):     --- Professional --- ?                       [phone], Colonoscopy, flexible; with removal of  ?                       tumor(s), polyp(s), or other lesion(s) by snare  ?                       technique ?Diagnosis Code(s):     --- Professional --- ?                       Z86.010, Personal history of colonic polyps ?                       K63.5, Polyp of colon ?CPT copyright 2019 American Medical Association. All rights reserved. ?The codes documented in this report are preliminary and upon coder review may   ?be revised to meet current compliance requirements. ?Dr. Ulyess Mort ?Dotti Busey Raeanne Gathers MD, MD ?07/15/2021 10:00:55 AM ?This report has been signed electronically. ?Number of Addenda: 0 ?Note Initiated On: 07/15/2021 9:30 AM ?Scope Withdrawal Time: 0 hours 12 minutes 53 seconds  ?Total Procedure Duration: 0 hours 14 minutes 45 seconds  ?Estimated Blood Loss:  Estimated blood loss: none. ?     [facility] ?

## 2021-07-18 ENCOUNTER — Encounter: Payer: Self-pay | Admitting: Gastroenterology

## 2021-07-18 LAB — SURGICAL PATHOLOGY

## 2021-07-19 ENCOUNTER — Encounter: Payer: Self-pay | Admitting: Gastroenterology

## 2021-08-11 ENCOUNTER — Telehealth: Payer: Self-pay | Admitting: Gastroenterology

## 2021-08-11 NOTE — Telephone Encounter (Signed)
Please advise what you recommend

## 2021-08-11 NOTE — Telephone Encounter (Signed)
His hiatal hernia is very small and I can refer him to Dr. Perrin Maltese to evaluate for hernia repair Lets increase Prilosec to 40 mg p.o. twice daily before meals for 3 months  RV

## 2021-08-11 NOTE — Telephone Encounter (Signed)
Patient is concerned about hiatal hernia and acid reflux. Patient is requesting a call from Dr Marius Ditch to get medical advice and what he needs to do. Patient states he is having shortness of breath.

## 2021-08-12 MED ORDER — PANTOPRAZOLE SODIUM 40 MG PO TBEC: 40.0000 mg | DELAYED_RELEASE_TABLET | Freq: Every day | ORAL | 0 refills | Status: DC

## 2021-08-12 NOTE — Telephone Encounter (Signed)
Sent in medication to the pharmacy and Called patient and left a message for call back

## 2021-08-12 NOTE — Addendum Note (Signed)
Addended by: Ulyess Blossom L on: 08/12/2021 08:24 AM   Modules accepted: Orders

## 2021-08-16 MED ORDER — OMEPRAZOLE 40 MG PO CPDR: 40.0000 mg | DELAYED_RELEASE_CAPSULE | Freq: Two times a day (BID) | ORAL | 0 refills | Status: DC

## 2021-08-16 NOTE — Addendum Note (Signed)
Addended by: Ulyess Blossom L on: 08/16/2021 09:25 AM   Modules accepted: Orders

## 2021-08-16 NOTE — Telephone Encounter (Signed)
Patient states lets try the increase Omeprazole first before referral and sent it to the pharmacy

## 2021-08-24 ENCOUNTER — Other Ambulatory Visit: Payer: Self-pay | Admitting: Family Medicine

## 2021-08-24 DIAGNOSIS — E291 Testicular hypofunction: Secondary | ICD-10-CM

## 2021-08-24 MED ORDER — TESTOSTERONE CYPIONATE 200 MG/ML IM SOLN: 100.0000 mg | INTRAMUSCULAR | 1 refills | Status: DC

## 2021-08-24 NOTE — Telephone Encounter (Signed)
Pt called to follow up on this Rx refill request FYI

## 2021-11-01 ENCOUNTER — Other Ambulatory Visit: Payer: Self-pay | Admitting: Family Medicine

## 2021-11-01 ENCOUNTER — Encounter: Payer: Self-pay | Admitting: Family Medicine

## 2021-11-01 ENCOUNTER — Ambulatory Visit: Payer: BC Managed Care – PPO | Admitting: Family Medicine

## 2021-11-01 VITALS — BP 127/82 | HR 89 | Temp 97.9°F | Resp 16 | Wt 273.0 lb

## 2021-11-01 DIAGNOSIS — E785 Hyperlipidemia, unspecified: Secondary | ICD-10-CM | POA: Diagnosis not present

## 2021-11-01 DIAGNOSIS — R739 Hyperglycemia, unspecified: Secondary | ICD-10-CM | POA: Diagnosis not present

## 2021-11-01 DIAGNOSIS — K2100 Gastro-esophageal reflux disease with esophagitis, without bleeding: Secondary | ICD-10-CM

## 2021-11-01 DIAGNOSIS — E291 Testicular hypofunction: Secondary | ICD-10-CM | POA: Diagnosis not present

## 2021-11-01 DIAGNOSIS — J4520 Mild intermittent asthma, uncomplicated: Secondary | ICD-10-CM

## 2021-11-01 DIAGNOSIS — K219 Gastro-esophageal reflux disease without esophagitis: Secondary | ICD-10-CM | POA: Diagnosis not present

## 2021-11-01 DIAGNOSIS — E790 Hyperuricemia without signs of inflammatory arthritis and tophaceous disease: Secondary | ICD-10-CM

## 2021-11-01 NOTE — Progress Notes (Signed)
I,Roshena L Chambers,acting as a scribe for Lelon Huh, MD.,have documented all relevant documentation on the behalf of Lelon Huh, MD,as directed by  Lelon Huh, MD while in the presence of Lelon Huh, MD.   Established patient visit   Patient: Gilbert Lee   DOB: 1965-12-06   56 y.o. Male  MRN: [id number] Visit Date: 11/01/2021  Today's healthcare provider: Lelon Huh, MD   Chief Complaint  Patient presents with   Hyperlipidemia   Gout   Subjective    HPI  Lipid/Cholesterol, Follow-up  Last lipid panel Other pertinent labs  Lab Results  Component Value Date   CHOL 172 08/11/2020   HDL 45 08/11/2020   LDLCALC 104 (H) 08/11/2020   TRIG 126 08/11/2020   CHOLHDL 3.8 08/11/2020   Lab Results  Component Value Date   ALT 30 12/14/2020   AST 24 12/14/2020   PLT 191 01/25/2021   TSH 0.972 12/14/2020     He was last seen for this on 08/11/2020.  Management since that visit includes continuing same medication.  He reports good compliance with treatment. He is not having side effects.   Symptoms: No chest pain No chest pressure/discomfort  No dyspnea No lower extremity edema  No numbness or tingling of extremity No orthopnea  No palpitations No paroxysmal nocturnal dyspnea  No speech difficulty No syncope   Current diet: in general, an "unhealthy" diet Current exercise: none  The 10-year ASCVD risk score (Arnett DK, et al., 2019) is: 6.1%  ---------------------------------------------------------------------------------------------------   Follow up for gout:  The patient was last seen for this on 08/11/2020.   Changes made at last visit include none; continue Allopurinol.  He reports good compliance with treatment. He feels that condition is Unchanged. He is not having side effects.   -----------------------------------------------------------------------------------------  He is also due to follow up hyperlipidemia,hypertension and  GERD. He is taking all medications consistently and is not aware of any adverse reactions. Does not check home blood pressures. Has not had any chest pains or dyspnea. He does have long history of mild asthma, but is no longer on maintenance inhaler and rarely needs to use albuterol inhaler.   He also continues on testosterone injections once a week which he feels is significantly improving his energy level and generally sense of well being.     Medications: Outpatient Medications Prior to Visit  Medication Sig   albuterol (VENTOLIN HFA) 108 (90 Base) MCG/ACT inhaler Inhale 1-2 puffs into the lungs every 6 (six) hours as needed for shortness of breath.   allopurinol (ZYLOPRIM) 100 MG tablet TAKE 1 TABLET BY MOUTH EVERY DAY WITH '300MG'$    allopurinol (ZYLOPRIM) 300 MG tablet Take 1 tablet (300 mg total) by mouth daily. Take with '100mg'$  tab for '400mg'$  total a day   Cholecalciferol (VITAMIN D3) 50 MCG (2000 UT) TABS Take 2,000 Units by mouth daily.    Coenzyme Q10 (COQ10) 100 MG CAPS Take 1 capsule by mouth daily.   Insulin Syringe-Needle U-100 (B-D INSULIN SYRINGE 1CC/25GX1") 25G X 1" 1 ML MISC Use once every 2 weeks for testosterone injection.   MAGNESIUM PO Take by mouth.   MELATONIN PO Take 3 mg by mouth at bedtime as needed (Sleep).    omeprazole (PRILOSEC) 40 MG capsule Take 1 capsule (40 mg total) by mouth 2 (two) times daily before a meal.   rosuvastatin (CRESTOR) 20 MG tablet Take 1 tablet (20 mg total) by mouth daily.   testosterone cypionate (DEPOTESTOSTERONE CYPIONATE) 200  MG/ML injection Inject 0.5 mLs (100 mg total) into the muscle every 7 (seven) days.   TURMERIC PO Take by mouth.   zinc gluconate 50 MG tablet Take 50 mg by mouth daily.   No facility-administered medications prior to visit.    Review of Systems  Constitutional:  Negative for appetite change, chills and fever.  Respiratory:  Negative for chest tightness, shortness of breath and wheezing.   Cardiovascular:   Negative for chest pain and palpitations.  Gastrointestinal:  Negative for abdominal pain, nausea and vomiting.       Objective    BP 127/82 (BP Location: Left Arm, Patient Position: Sitting, Cuff Size: Large)   Pulse 89   Temp 97.9 F (36.6 C) (Oral)   Resp 16   Wt 273 lb (123.8 kg)   SpO2 98% Comment: room air  BMI 37.03 kg/m    Physical Exam    General: Appearance:    Obese male in no acute distress  Eyes:    PERRL, conjunctiva/corneas clear, EOM's intact       Lungs:     Clear to auscultation bilaterally, respirations unlabored  Heart:    Normal heart rate. Normal rhythm. No murmurs, rubs, or gallops.    MS:   All extremities are intact.    Neurologic:   Awake, alert, oriented x 3. No apparent focal neurological defect.         Assessment & Plan     1. Hyperlipidemia, unspecified hyperlipidemia type He is tolerating rosuvastatin well with no adverse effects.   - Lipid panel - Comprehensive metabolic panel  2. Hyperuricemia No gout flares on current dose of allopurinol.  - Uric acid  3.  Mild intermittent asthma without complication No longer requires maintenance inhalers. Continue prn albuterol which he requires rarely.   4. Gastroesphageal reflux disease, unspecified whether esophagitis present Well controlled, previously required treatment for associated stricture by Dr. Marius Ditch. Now  well controlled with otc omeprozole.  - Magnesium  5. Hypogonadism in male Doing well with current testosterone regiment - CBC - Testosterone,Free and Total - EKG 12-Lead  6. Hyperglycemia  - Hemoglobin A1c - EKG 12-Lead      The entirety of the information documented in the History of Present Illness, Review of Systems and Physical Exam were personally obtained by me. Portions of this information were initially documented by the CMA and reviewed by me for thoroughness and accuracy.     Lelon Huh, MD  [facility] [phone] (phone) [phone]  (fax)  [place]

## 2021-11-01 NOTE — Telephone Encounter (Signed)
Last blood work done 08/11/2020. Patient overdue for follow up OV and labs. Appointment scheduled today at 9:20am.

## 2021-11-02 DIAGNOSIS — E291 Testicular hypofunction: Secondary | ICD-10-CM | POA: Diagnosis not present

## 2021-11-02 DIAGNOSIS — E790 Hyperuricemia without signs of inflammatory arthritis and tophaceous disease: Secondary | ICD-10-CM | POA: Diagnosis not present

## 2021-11-02 DIAGNOSIS — R739 Hyperglycemia, unspecified: Secondary | ICD-10-CM | POA: Diagnosis not present

## 2021-11-02 DIAGNOSIS — E785 Hyperlipidemia, unspecified: Secondary | ICD-10-CM | POA: Diagnosis not present

## 2021-11-03 ENCOUNTER — Encounter: Payer: Self-pay | Admitting: Family Medicine

## 2021-11-03 DIAGNOSIS — R7303 Prediabetes: Secondary | ICD-10-CM | POA: Insufficient documentation

## 2021-11-08 ENCOUNTER — Encounter: Payer: Self-pay | Admitting: Family Medicine

## 2021-11-09 LAB — MAGNESIUM: Magnesium: 1.9 mg/dL (ref 1.6–2.3)

## 2021-11-09 LAB — COMPREHENSIVE METABOLIC PANEL
ALT: 35 IU/L (ref 0–44)
AST: 28 IU/L (ref 0–40)
Albumin/Globulin Ratio: 1.8 (ref 1.2–2.2)
Albumin: 4.3 g/dL (ref 3.8–4.9)
Alkaline Phosphatase: 50 IU/L (ref 44–121)
BUN/Creatinine Ratio: 13 (ref 9–20)
BUN: 15 mg/dL (ref 6–24)
Bilirubin Total: 0.3 mg/dL (ref 0.0–1.2)
CO2: 22 mmol/L (ref 20–29)
Calcium: 9.2 mg/dL (ref 8.7–10.2)
Chloride: 104 mmol/L (ref 96–106)
Creatinine, Ser: 1.16 mg/dL (ref 0.76–1.27)
Globulin, Total: 2.4 g/dL (ref 1.5–4.5)
Glucose: 113 mg/dL — ABNORMAL HIGH (ref 70–99)
Potassium: 4.3 mmol/L (ref 3.5–5.2)
Sodium: 141 mmol/L (ref 134–144)
Total Protein: 6.7 g/dL (ref 6.0–8.5)
eGFR: 73 mL/min/{1.73_m2} (ref >59)

## 2021-11-09 LAB — CBC
Hematocrit: 46.1 % (ref 37.5–51.0)
Hemoglobin: 15.2 g/dL (ref 13.0–17.7)
MCH: 28.1 pg (ref 26.6–33.0)
MCHC: 33 g/dL (ref 31.5–35.7)
MCV: 85 fL (ref 79–97)
Platelets: 211 10*3/uL (ref 150–450)
RBC: 5.41 x10E6/uL (ref 4.14–5.80)
RDW: 14.4 % (ref 11.6–15.4)
WBC: 7.5 10*3/uL (ref 3.4–10.8)

## 2021-11-09 LAB — TESTOSTERONE,FREE AND TOTAL
Testosterone, Free: 10.5 pg/mL (ref 7.2–24.0)
Testosterone: 559 ng/dL (ref 264–916)

## 2021-11-09 LAB — LIPID PANEL
Chol/HDL Ratio: 4 ratio (ref 0.0–5.0)
Cholesterol, Total: 160 mg/dL (ref 100–199)
HDL: 40 mg/dL (ref >39)
LDL Chol Calc (NIH): 85 mg/dL (ref 0–99)
Triglycerides: 210 mg/dL — ABNORMAL HIGH (ref 0–149)
VLDL Cholesterol Cal: 35 mg/dL (ref 5–40)

## 2021-11-09 LAB — HEMOGLOBIN A1C
Est. average glucose Bld gHb Est-mCnc: 123 mg/dL
Hgb A1c MFr Bld: 5.9 % — ABNORMAL HIGH (ref 4.8–5.6)

## 2021-11-09 LAB — URIC ACID: Uric Acid: 5.4 mg/dL (ref 3.8–8.4)

## 2021-11-10 ENCOUNTER — Other Ambulatory Visit: Payer: Self-pay | Admitting: Gastroenterology

## 2021-11-22 ENCOUNTER — Other Ambulatory Visit: Payer: Self-pay | Admitting: Gastroenterology

## 2021-11-30 ENCOUNTER — Other Ambulatory Visit: Payer: Self-pay | Admitting: Family Medicine

## 2021-11-30 ENCOUNTER — Other Ambulatory Visit: Payer: Self-pay | Admitting: Gastroenterology

## 2021-11-30 DIAGNOSIS — E790 Hyperuricemia without signs of inflammatory arthritis and tophaceous disease: Secondary | ICD-10-CM

## 2021-12-06 ENCOUNTER — Telehealth: Payer: Self-pay | Admitting: Gastroenterology

## 2021-12-06 MED ORDER — OMEPRAZOLE 40 MG PO CPDR: 40.0000 mg | DELAYED_RELEASE_CAPSULE | Freq: Two times a day (BID) | ORAL | 1 refills | Status: DC

## 2021-12-06 NOTE — Telephone Encounter (Signed)
Patient states that he had a refill on the pantoprazole '40mg'$  once a day but not the omeprazole he asked if he could take the pantoprazole I advised patient no since that is not what the provider prescribe for him at this time. Informed patient for [place] to refill the omeprazole then I would need to make him a appointment. Made him a appointment

## 2021-12-06 NOTE — Addendum Note (Signed)
Addended by: Ulyess Blossom L on: 12/06/2021 03:43 PM   Modules accepted: Orders

## 2021-12-06 NOTE — Telephone Encounter (Signed)
Patient has a few questions about the prescription omeprazole that Dr Marius Ditch prescribed. Requesting call back.

## 2022-01-17 ENCOUNTER — Ambulatory Visit: Payer: BC Managed Care – PPO | Admitting: Gastroenterology

## 2022-01-25 ENCOUNTER — Encounter: Payer: Self-pay | Admitting: Gastroenterology

## 2022-01-25 ENCOUNTER — Telehealth: Payer: BC Managed Care – PPO | Admitting: Gastroenterology

## 2022-01-25 ENCOUNTER — Other Ambulatory Visit: Payer: Self-pay

## 2022-01-25 DIAGNOSIS — K222 Esophageal obstruction: Secondary | ICD-10-CM | POA: Diagnosis not present

## 2022-01-25 DIAGNOSIS — K2210 Ulcer of esophagus without bleeding: Secondary | ICD-10-CM | POA: Diagnosis not present

## 2022-01-25 NOTE — Progress Notes (Signed)
Cephas Darby, MD [address]  [place]  [place], [place] [address]  Main: [phone]  Fax: [phone]    Gastroenterology Consultation  Referring Provider:     Birdie Sons, MD Primary Care Physician:  Birdie Sons, MD Primary Gastroenterologist:  Dr. Cephas Darby Reason for Consultation:  GERD, dysphagia        HPI:   Gilbert Lee is a 56 y.o. male referred by Dr. Birdie Sons, MD  for consultation & management of chronic GERD and dysphagia.  Patient reports symptoms of acid reflux and intermittent dysphagia for several months.  He gained weight within last 1 year due to pandemic and decreased physical activity. Patient originally presented in 10/2018 secondary to food impaction after eating steak.  He underwent urgent endoscopy.  Thereafter, I started him on omeprazole 40 mg daily.  He underwent repeat endoscopy which showed improvement in esophagitis, edema presence of mild stenosis in the lower esophagus which was traversed with mild resistance, that has resulted in mucosal disruption.  Balloon dilation was not performed at that time. There was no evidence of eosinophilic esophagitis on biopsies other than mild eosinophilia.  I started him on omeprazole 40 mg 2 times a day, later he developed gout flare and therefore omeprazole was stopped.  His gout flare has resolved.  He then started taking vitamin supplements, that has resulted in another flare.  He is not sure if omeprazole has really caused a gout flare.  He is on Pepcid.  He has been following antireflux lifestyle taking Pepcid as needed.  He reports feeling better with regards to his reflux symptoms.  He has not attempted to eat hard meats since then.  He denies difficulty swallowing.  He cut back on coffee significantly.  He drinks a glass of wine intermittently.  He does not smoke tobacco  Follow-up visit 01/25/2022 Patient made a video visit for follow-up of chronic GERD, erosive esophagitis and  esophageal stricture.  He is taking omeprazole 40 mg twice daily with control of reflux symptoms.  He is avoiding hard meats because of the presence of esophageal stricture.  His hemoglobin A1c increased to 5.9.  He cut back on consuming as needed pizza, lost about 7 pounds.  He does not have any other concerns today  NSAIDs: None  Antiplts/Anticoagulants/Anti thrombotics: None  GI Procedures:  Upper endoscopy 11/14/2018  - Food in the middle third of the esophagus and in the lower third of the esophagus. Removal was successful. - Acute esophagitis. - Normal stomach. - Normal duodenal bulb and second portion of the duodenum.  Upper endoscopy 04/09/2019 - Normal duodenal bulb and second portion of the duodenum. - Small hiatal hernia. - Normal stomach. - LA Grade A reflux esophagitis with no bleeding. - Benign-appearing esophageal stenosis. - Normal esophagus. Biopsied.  DIAGNOSIS:  A. ESOPHAGUS, DISTAL; COLD BIOPSY:  - BENIGN SQUAMOUS MUCOSA WITH ACANTHOSIS, SPONGIOSIS, BASAL HYPERPLASIA,  AND MIXED INFLAMMATION, INCLUDING MILD EOSINOPHILIA (FOCALLY UP TO 10  EOSINOPHILS PER HPF).  - NEGATIVE FOR DYSPLASIA AND MALIGNANCY.   B. ESOPHAGUS, PROXIMAL; COLD BIOPSY:  - BENIGN SQUAMOUS MUCOSA WITH ACANTHOSIS, SPONGIOSIS, BASAL HYPERPLASIA,  AND MIXED INFLAMMATION, INCLUDING MILD EOSINOPHILIA (FOCALLY UP TO 12  EOSINOPHILS PER HPF).  - NEGATIVE FOR DYSPLASIA AND MALIGNANCY.   Comment:  The histologic features would favor reflux esophagitis. Of note, mild to  moderate eosinophilia may be present in reflux esophagitis.  Colonoscopy 11/11/2015  - Two 2 to 3 mm polyps  in the transverse colon, removed with a cold biopsy forceps. Resected and retrieved. - Two 2 to 3 mm polyps in the sigmoid colon, removed with a cold biopsy forceps. Resected and retrieved. - Non-bleeding internal hemorrhoids.    Colonoscopy 07/15/2021 - One 5 mm polyp in the sigmoid colon, removed with a cold snare.  Resected and retrieved. - The distal rectum and anal verge are normal on retroflexion view. DIAGNOSIS:  A. COLON POLYP, SIGMOID; COLD SNARE:  - HYPERPLASTIC POLYP.  - NEGATIVE FOR DYSPLASIA AND MALIGNANCY.   Past Medical History:  Diagnosis Date   Arthritis    Asthma    well controlled   Esophageal stricture    GERD (gastroesophageal reflux disease)    Gout    History of hiatal hernia     Past Surgical History:  Procedure Laterality Date   COLONOSCOPY WITH PROPOFOL N/A 11/11/2015   Procedure: COLONOSCOPY WITH PROPOFOL;  Surgeon: Lucilla Lame, MD;  Location: [place];  Service: Endoscopy;  Laterality: N/A;   COLONOSCOPY WITH PROPOFOL N/A 07/15/2021   Procedure: COLONOSCOPY WITH PROPOFOL;  Surgeon: Lin Landsman, MD;  Location: [facility] ENDOSCOPY;  Service: Gastroenterology;  Laterality: N/A;   ESOPHAGOGASTRODUODENOSCOPY (EGD) WITH PROPOFOL N/A 11/14/2018   Procedure: ESOPHAGOGASTRODUODENOSCOPY (EGD) WITH PROPOFOL;  Surgeon: Lin Landsman, MD;  Location: [facility] ENDOSCOPY;  Service: Gastroenterology;  Laterality: N/A;   ESOPHAGOGASTRODUODENOSCOPY (EGD) WITH PROPOFOL N/A 04/09/2019   Procedure: ESOPHAGOGASTRODUODENOSCOPY (EGD) WITH PROPOFOL;  Surgeon: Lin Landsman, MD;  Location: [facility] ENDOSCOPY;  Service: Gastroenterology;  Laterality: N/A;   KNEE ARTHROSCOPY Right    ORIF TOE FRACTURE Left 11/07/2019   Procedure: OPEN REDUCTION INTERNAL FIXATION (ORIF) METATARSAL (TOE) FRACTURE LEFT 5TH;  Surgeon: Samara Deist, DPM;  Location: [facility] ORS;  Service: Podiatry;  Laterality: Left;   POLYPECTOMY N/A 11/11/2015   Procedure: POLYPECTOMY;  Surgeon: Lucilla Lame, MD;  Location: [place];  Service: Endoscopy;  Laterality: N/A;    Current Outpatient Medications:    albuterol (VENTOLIN HFA) 108 (90 Base) MCG/ACT inhaler, Inhale 1-2 puffs into the lungs every 6 (six) hours as needed for shortness of breath., Disp: 18 g, Rfl: 1   allopurinol (ZYLOPRIM) 100 MG tablet,  TAKE ONE TABLET BY MOUTH DAILY WITH '300MG'$  TABLET, Disp: 90 tablet, Rfl: 4   allopurinol (ZYLOPRIM) 300 MG tablet, TAKE ONE TABLET BY MOUTH DAILY WITH '100MG'$  TABLET FOR A TOTAL '400MG'$  PER DAY, Disp: 90 tablet, Rfl: 0   Cholecalciferol (VITAMIN D3) 50 MCG (2000 UT) TABS, Take 2,000 Units by mouth daily. , Disp: , Rfl:    Coenzyme Q10 (COQ10) 100 MG CAPS, Take 1 capsule by mouth daily., Disp: , Rfl:    Insulin Syringe-Needle U-100 (B-D INSULIN SYRINGE 1CC/25GX1") 25G X 1" 1 ML MISC, Use once every 2 weeks for testosterone injection., Disp: 50 each, Rfl: 0   MAGNESIUM PO, Take by mouth., Disp: , Rfl:    MELATONIN PO, Take 3 mg by mouth at bedtime as needed (Sleep). , Disp: , Rfl:    omeprazole (PRILOSEC) 40 MG capsule, Take 1 capsule (40 mg total) by mouth 2 (two) times daily before a meal., Disp: 60 capsule, Rfl: 1   rosuvastatin (CRESTOR) 20 MG tablet, TAKE ONE TABLET BY MOUTH DAILY, Disp: 90 tablet, Rfl: 0   testosterone cypionate (DEPOTESTOSTERONE CYPIONATE) 200 MG/ML injection, Inject 0.5 mLs (100 mg total) into the muscle every 7 (seven) days., Disp: 10 mL, Rfl: 1   TURMERIC PO, Take by mouth., Disp: , Rfl:    zinc  gluconate 50 MG tablet, Take 50 mg by mouth daily., Disp: , Rfl:    Family History  Problem Relation Age of Onset   Other Father        intestinal obstruction   Prostate cancer Father    Parkinsonism Mother    Deep vein thrombosis Brother    Deep vein thrombosis Brother      Social History   Tobacco Use   Smoking status: Never   Smokeless tobacco: Never  Vaping Use   Vaping Use: Never used  Substance Use Topics   Alcohol use: Yes    Alcohol/week: 0.0 standard drinks of alcohol    Comment: 2-3 weekly   Drug use: No    Allergies as of 01/25/2022   (No Known Allergies)    Review of Systems:    All systems reviewed and negative except where noted in HPI.   Imaging Studies: Reviewed  Assessment and Plan:   Gilbert Lee is a 56 y.o. male with BMI of 34.5,  gout, chronic GERD is seen in consultation for chronic GERD and dysphagia.  EGD revealed erosive esophagitis and benign esophageal stricture.  Currently symptoms are under control with high-dose PPI use  Recommend repeat EGD to evaluate esophageal stricture and confirm healing of erosive esophagitis and rule out underlying Barrett's esophagus Continue omeprazole 40 mg twice daily before meals until upper endoscopy, plan to wean down to the lowest dose possible based on the EGD findings Continue antireflux lifestyle  Reiterated on healthy eating habits, exercise to lose weight, patient is motivated   Follow up in 6 months   Cephas Darby, MD

## 2022-01-27 ENCOUNTER — Encounter: Payer: Self-pay | Admitting: Gastroenterology

## 2022-02-01 NOTE — Anesthesia Preprocedure Evaluation (Signed)
Anesthesia Evaluation  Patient identified by MRN, date of birth, ID band Patient awake    Reviewed: Allergy & Precautions, NPO status , Patient's Chart, lab work & pertinent test results  History of Anesthesia Complications Negative for: history of anesthetic complications  Airway Mallampati: III  TM Distance: >3 FB Neck ROM: Full    Dental no notable dental hx.    Pulmonary asthma (mild intermittent) , neg sleep apnea   breath sounds clear to auscultation- rhonchi (-) wheezing      Cardiovascular Exercise Tolerance: Good (-) hypertension(-) CAD, (-) Past MI, (-) Cardiac Stents and (-) CABG  Rhythm:Regular Rate:Normal - Systolic murmurs and - Diastolic murmurs    Neuro/Psych neg Seizures negative neurological ROS  negative psych ROS   GI/Hepatic Neg liver ROS, hiatal hernia,GERD  Controlled and Medicated,,  Endo/Other  neg diabetes    Renal/GU negative Renal ROS     Musculoskeletal  (+) Arthritis ,    Abdominal  (+) + obese  Peds  Hematology negative hematology ROS (+)   Anesthesia Other Findings Past Medical History: No date: Arthritis No date: Asthma     Comment:  well controlled No date: GERD (gastroesophageal reflux disease) No date: Gout No date: History of hiatal hernia   Reproductive/Obstetrics                             Anesthesia Physical Anesthesia Plan  ASA: II  Anesthesia Plan: General   Post-op Pain Management:    Induction: Intravenous  PONV Risk Score and Plan: 1  Airway Management Planned: Natural Airway  Additional Equipment:   Intra-op Plan:   Post-operative Plan:   Informed Consent: I have reviewed the patients History and Physical, chart, labs and discussed the procedure including the risks, benefits and alternatives for the proposed anesthesia with the patient or authorized representative who has indicated his/her understanding and acceptance.      Dental advisory given  Plan Discussed with: CRNA and Anesthesiologist  Anesthesia Plan Comments:         Anesthesia Quick Evaluation

## 2022-02-02 ENCOUNTER — Ambulatory Visit: Payer: BC Managed Care – PPO | Admitting: Anesthesiology

## 2022-02-02 ENCOUNTER — Other Ambulatory Visit: Payer: Self-pay

## 2022-02-02 ENCOUNTER — Encounter
Admission: RE | Disposition: A | Discharge status: Home / Self Care | Payer: Self-pay | Source: Ambulatory Visit | Attending: Gastroenterology

## 2022-02-02 ENCOUNTER — Other Ambulatory Visit: Payer: Self-pay | Admitting: Family Medicine

## 2022-02-02 ENCOUNTER — Encounter: Payer: Self-pay | Admitting: Gastroenterology

## 2022-02-02 ENCOUNTER — Ambulatory Visit
Admission: RE | Admit: 2022-02-02 | Discharge: 2022-02-02 | Disposition: A | Discharge status: Home / Self Care | Payer: BC Managed Care – PPO | Source: Ambulatory Visit | Attending: Gastroenterology | Admitting: Gastroenterology

## 2022-02-02 DIAGNOSIS — K449 Diaphragmatic hernia without obstruction or gangrene: Secondary | ICD-10-CM

## 2022-02-02 DIAGNOSIS — E669 Obesity, unspecified: Secondary | ICD-10-CM | POA: Diagnosis not present

## 2022-02-02 DIAGNOSIS — K219 Gastro-esophageal reflux disease without esophagitis: Secondary | ICD-10-CM | POA: Diagnosis not present

## 2022-02-02 DIAGNOSIS — K222 Esophageal obstruction: Secondary | ICD-10-CM | POA: Diagnosis not present

## 2022-02-02 DIAGNOSIS — E790 Hyperuricemia without signs of inflammatory arthritis and tophaceous disease: Secondary | ICD-10-CM

## 2022-02-02 DIAGNOSIS — K2289 Other specified disease of esophagus: Secondary | ICD-10-CM | POA: Diagnosis not present

## 2022-02-02 DIAGNOSIS — J4520 Mild intermittent asthma, uncomplicated: Secondary | ICD-10-CM | POA: Diagnosis not present

## 2022-02-02 DIAGNOSIS — M1990 Unspecified osteoarthritis, unspecified site: Secondary | ICD-10-CM | POA: Insufficient documentation

## 2022-02-02 DIAGNOSIS — Z6835 Body mass index (BMI) 35.0-35.9, adult: Secondary | ICD-10-CM | POA: Insufficient documentation

## 2022-02-02 DIAGNOSIS — R1319 Other dysphagia: Secondary | ICD-10-CM | POA: Diagnosis not present

## 2022-02-02 DIAGNOSIS — K2210 Ulcer of esophagus without bleeding: Secondary | ICD-10-CM | POA: Diagnosis not present

## 2022-02-02 DIAGNOSIS — R7303 Prediabetes: Secondary | ICD-10-CM

## 2022-02-02 DIAGNOSIS — L83 Acanthosis nigricans: Secondary | ICD-10-CM | POA: Insufficient documentation

## 2022-02-02 DIAGNOSIS — R1314 Dysphagia, pharyngoesophageal phase: Secondary | ICD-10-CM | POA: Insufficient documentation

## 2022-02-02 DIAGNOSIS — E785 Hyperlipidemia, unspecified: Secondary | ICD-10-CM

## 2022-02-02 HISTORY — DX: Prediabetes: R73.03

## 2022-02-02 HISTORY — PX: ESOPHAGOGASTRODUODENOSCOPY (EGD) WITH PROPOFOL: SHX5813

## 2022-02-02 SURGERY — ESOPHAGOGASTRODUODENOSCOPY (EGD) WITH PROPOFOL
Anesthesia: General | Site: Mouth

## 2022-02-02 MED ORDER — LACTATED RINGERS IV SOLN: INTRAVENOUS | Status: DC

## 2022-02-02 MED ORDER — GLYCOPYRROLATE 0.2 MG/ML IJ SOLN
INTRAMUSCULAR | Status: DC | PRN
  Administered 2022-02-02: .2 mg via INTRAVENOUS

## 2022-02-02 MED ORDER — SODIUM CHLORIDE 0.9 % IV SOLN: INTRAVENOUS | Status: DC

## 2022-02-02 MED ORDER — PROPOFOL 10 MG/ML IV BOLUS
INTRAVENOUS | Status: DC | PRN
  Administered 2022-02-02: 50 mg via INTRAVENOUS
  Administered 2022-02-02: 100 mg via INTRAVENOUS
  Administered 2022-02-02: 20 mg via INTRAVENOUS

## 2022-02-02 MED ORDER — STERILE WATER FOR IRRIGATION IR SOLN
Status: DC | PRN
  Administered 2022-02-02: 50 mL

## 2022-02-02 MED ORDER — LIDOCAINE HCL (CARDIAC) PF 100 MG/5ML IV SOSY
PREFILLED_SYRINGE | INTRAVENOUS | Status: DC | PRN
  Administered 2022-02-02: 60 mg via INTRAVENOUS

## 2022-02-02 SURGICAL SUPPLY — 32 items

## 2022-02-02 NOTE — Op Note (Signed)
[facility] Patient Name: Gilbert Lee Procedure Date: 02/02/2022 12:37 PM MRN: [id number] Account #: [id number] Date of Birth: December 02, 1965 Admit Type: Outpatient Age: 56 Room: [facility] OR ROOM 01 Gender: Male Note Status: Finalized Instrument Name: [id number] Procedure:             Upper GI endoscopy Indications:           Esophageal dysphagia, Follow-up of esophagitis Providers:             Lin Landsman MD, MD Referring MD:          Kirstie Peri. Caryn Section, MD (Referring MD) Medicines:             General Anesthesia Complications:         No immediate complications. Estimated blood loss: None. Procedure:             Pre-Anesthesia Assessment:                        - Prior to the procedure, a History and Physical was                         performed, and patient medications and allergies were                         reviewed. The patient is competent. The risks and                         benefits of the procedure and the sedation options and                         risks were discussed with the patient. All questions                         were answered and informed consent was obtained.                         Patient identification and proposed procedure were                         verified by the physician, the nurse, the                         anesthesiologist, the anesthetist and the technician                         in the pre-procedure area in the procedure room in the                         endoscopy suite. Mental Status Examination: alert and                         oriented. Airway Examination: normal oropharyngeal                         airway and neck mobility. Respiratory Examination:                         clear to auscultation. CV Examination: normal.  Prophylactic Antibiotics: The patient does not require                         prophylactic antibiotics. Prior Anticoagulants: The                          patient has taken no anticoagulant or antiplatelet                         agents. ASA Grade Assessment: II - A patient with mild                         systemic disease. After reviewing the risks and                         benefits, the patient was deemed in satisfactory                         condition to undergo the procedure. The anesthesia                         plan was to use general anesthesia. Immediately prior                         to administration of medications, the patient was                         re-assessed for adequacy to receive sedatives. The                         heart rate, respiratory rate, oxygen saturations,                         blood pressure, adequacy of pulmonary ventilation, and                         response to care were monitored throughout the                         procedure. The physical status of the patient was                         re-assessed after the procedure.                        After obtaining informed consent, the endoscope was                         passed under direct vision. Throughout the procedure,                         the patient's blood pressure, pulse, and oxygen                         saturations were monitored continuously. The Endoscope                         was introduced through the mouth, and advanced to the  second part of duodenum. The upper GI endoscopy was                         accomplished without difficulty. The patient tolerated                         the procedure well. Findings:      The duodenal bulb and second portion of the duodenum were normal.      The entire examined stomach was normal.      A small hiatal hernia was present.      The gastroesophageal junction and examined esophagus were normal.       Biopsies were obtained from the proximal and distal esophagus with cold       forceps for histology of suspected eosinophilic esophagitis.      A single area of  ectopic gastric mucosa was found in the upper third of       the esophagus.      The cardia and gastric fundus were normal on retroflexion. Impression:            - Normal duodenal bulb and second portion of the                         duodenum.                        - Normal stomach.                        - Small hiatal hernia.                        - Normal gastroesophageal junction and esophagus.                        - Ectopic gastric mucosa in the upper third of the                         esophagus.                        - Biopsies were taken with a cold forceps for                         evaluation of eosinophilic esophagitis. Recommendation:        - Discharge patient to home (with escort).                        - Resume previous diet today.                        - Continue present medications.                        - Await pathology results.                        - Return to my office as previously scheduled. Procedure Code(s):     --- Professional ---                        [phone], Esophagogastroduodenoscopy, flexible,  transoral; with biopsy, single or multiple Diagnosis Code(s):     --- Professional ---                        K44.9, Diaphragmatic hernia without obstruction or                         gangrene                        Q40.2, Other specified congenital malformations of                         stomach                        R13.14, Dysphagia, pharyngoesophageal phase                        K20.90, Esophagitis, unspecified without bleeding CPT copyright 2022 American Medical Association. All rights reserved. The codes documented in this report are preliminary and upon coder review may  be revised to meet current compliance requirements. Dr. Ulyess Mort Lin Landsman MD, MD 02/02/2022 1:04:15 PM This report has been signed electronically. Number of Addenda: 0 Note Initiated On: 02/02/2022 12:37 PM Total Procedure Duration:  0 hours 6 minutes 2 seconds  Estimated Blood Loss:  Estimated blood loss: none. Estimated blood loss: none.      [facility]

## 2022-02-02 NOTE — Anesthesia Postprocedure Evaluation (Signed)
Anesthesia Post Note  Patient: Gilbert Lee  Procedure(s) Performed: ESOPHAGOGASTRODUODENOSCOPY (EGD) WITH BIOPSY (Mouth)  Patient location during evaluation: PACU Anesthesia Type: General Level of consciousness: awake and alert Pain management: pain level controlled Vital Signs Assessment: post-procedure vital signs reviewed and stable Respiratory status: spontaneous breathing, nonlabored ventilation and respiratory function stable Cardiovascular status: blood pressure returned to baseline and stable Postop Assessment: no apparent nausea or vomiting Anesthetic complications: no   No notable events documented.   Last Vitals:  Vitals:   02/02/22 1305 02/02/22 1306  BP: 133/88   Pulse: 83 83  Resp: 18 19  Temp: 36.9 C   SpO2:  96%    Last Pain:  Vitals:   02/02/22 1306  TempSrc:   PainSc: 0-No pain                 [place] Ouch

## 2022-02-02 NOTE — H&P (Signed)
Cephas Darby, MD [address]  [place]  [place], [place] [address]  Main: [phone]  Fax: [phone] Pager: [phone]  Primary Care Physician:  Birdie Sons, MD Primary Gastroenterologist:  Dr. Cephas Darby  Pre-Procedure History & Physical: HPI:  Gilbert Lee is a 56 y.o. male is here for an endoscopy.   Past Medical History:  Diagnosis Date   Arthritis    Asthma    well controlled   Esophageal stricture    GERD (gastroesophageal reflux disease)    Gout    History of hiatal hernia    Pre-diabetes     Past Surgical History:  Procedure Laterality Date   COLONOSCOPY WITH PROPOFOL N/A 11/11/2015   Procedure: COLONOSCOPY WITH PROPOFOL;  Surgeon: Lucilla Lame, MD;  Location: [place];  Service: Endoscopy;  Laterality: N/A;   COLONOSCOPY WITH PROPOFOL N/A 07/15/2021   Procedure: COLONOSCOPY WITH PROPOFOL;  Surgeon: Lin Landsman, MD;  Location: [facility] ENDOSCOPY;  Service: Gastroenterology;  Laterality: N/A;   ESOPHAGOGASTRODUODENOSCOPY (EGD) WITH PROPOFOL N/A 11/14/2018   Procedure: ESOPHAGOGASTRODUODENOSCOPY (EGD) WITH PROPOFOL;  Surgeon: Lin Landsman, MD;  Location: [facility] ENDOSCOPY;  Service: Gastroenterology;  Laterality: N/A;   ESOPHAGOGASTRODUODENOSCOPY (EGD) WITH PROPOFOL N/A 04/09/2019   Procedure: ESOPHAGOGASTRODUODENOSCOPY (EGD) WITH PROPOFOL;  Surgeon: Lin Landsman, MD;  Location: [facility] ENDOSCOPY;  Service: Gastroenterology;  Laterality: N/A;   KNEE ARTHROSCOPY Right    ORIF TOE FRACTURE Left 11/07/2019   Procedure: OPEN REDUCTION INTERNAL FIXATION (ORIF) METATARSAL (TOE) FRACTURE LEFT 5TH;  Surgeon: Samara Deist, DPM;  Location: [facility] ORS;  Service: Podiatry;  Laterality: Left;   POLYPECTOMY N/A 11/11/2015   Procedure: POLYPECTOMY;  Surgeon: Lucilla Lame, MD;  Location: [place];  Service: Endoscopy;  Laterality: N/A;    Prior to Admission medications   Medication Sig Start Date End Date Taking? Authorizing  Provider  albuterol (VENTOLIN HFA) 108 (90 Base) MCG/ACT inhaler Inhale 1-2 puffs into the lungs every 6 (six) hours as needed for shortness of breath. 04/22/21  Yes Marney Setting, NP  allopurinol (ZYLOPRIM) 100 MG tablet TAKE ONE TABLET BY MOUTH DAILY WITH '300MG'$  TABLET 12/01/21  Yes Birdie Sons, MD  allopurinol (ZYLOPRIM) 300 MG tablet TAKE 1 TABLET BY MOUTH DAILY ALONG WITH '100MG'$  TABLET FOR TOTAL DAILY DOSE OF '400MG'$  02/02/22  Yes Birdie Sons, MD  aspirin EC 81 MG tablet Take 81 mg by mouth daily. Swallow whole.   Yes [provider]  b complex vitamins capsule Take 1 capsule by mouth daily.   Yes [provider]  Cholecalciferol (VITAMIN D3) 50 MCG (2000 UT) TABS Take 2,000 Units by mouth daily.    Yes [provider]  Coenzyme Q10 (COQ10) 100 MG CAPS Take 1 capsule by mouth daily.   Yes [provider]  MAGNESIUM PO Take by mouth.   Yes [provider]  MELATONIN PO Take 3 mg by mouth at bedtime as needed (Sleep).    Yes [provider]  omeprazole (PRILOSEC) 40 MG capsule Take 1 capsule (40 mg total) by mouth 2 (two) times daily before a meal. 12/06/21 03/06/22 Yes Alvaretta Eisenberger, Tally Due, MD  rosuvastatin (CRESTOR) 20 MG tablet TAKE 1 TABLET BY MOUTH DAILY 02/02/22  Yes Birdie Sons, MD  TURMERIC PO Take by mouth.   Yes [provider]  zinc gluconate 50 MG tablet Take 50 mg by mouth daily.   Yes [provider]  Insulin Syringe-Needle U-100 (B-D INSULIN SYRINGE 1CC/25GX1") 25G  X 1" 1 ML MISC Use once every 2 weeks for testosterone injection. 12/21/20   Birdie Sons, MD  testosterone cypionate (DEPOTESTOSTERONE CYPIONATE) 200 MG/ML injection Inject 0.5 mLs (100 mg total) into the muscle every 7 (seven) days. 08/24/21   Birdie Sons, MD    Allergies as of 01/25/2022   (No Known Allergies)    Family History  Problem Relation Age of Onset   Other Father        intestinal obstruction   Prostate cancer  Father    Parkinsonism Mother    Deep vein thrombosis Brother    Deep vein thrombosis Brother     Social History   Socioeconomic History   Marital status: Married    Spouse name: Not on file   Number of children: Not on file   Years of education: Not on file   Highest education level: Not on file  Occupational History   Not on file  Tobacco Use   Smoking status: Never   Smokeless tobacco: Never  Vaping Use   Vaping Use: Never used  Substance and Sexual Activity   Alcohol use: Yes    Alcohol/week: 3.0 - 4.0 standard drinks of alcohol    Types: 3 - 4 Cans of beer per week    Comment: 2-3 weekly   Drug use: No   Sexual activity: Not on file  Other Topics Concern   Not on file  Social History Narrative   Not on file   Social Determinants of Health   Financial Resource Strain: Not on file  Food Insecurity: Not on file  Transportation Needs: Not on file  Physical Activity: Not on file  Stress: Not on file  Social Connections: Not on file  Intimate Partner Violence: Not on file    Review of Systems: See HPI, otherwise negative ROS  Physical Exam: BP (!) 141/87   Pulse 78   Temp 97.6 F (36.4 C) (Temporal)   Ht 6' (1.829 m)   Wt 120.2 kg   SpO2 96%   BMI 35.94 kg/m  General:   Alert,  pleasant and cooperative in NAD Head:  Normocephalic and atraumatic. Neck:  Supple; no masses or thyromegaly. Lungs:  Clear throughout to auscultation.    Heart:  Regular rate and rhythm. Abdomen:  Soft, nontender and nondistended. Normal bowel sounds, without guarding, and without rebound.   Neurologic:  Alert and  oriented x4;  grossly normal neurologically.  Impression/Plan: Gilbert Lee is here for an endoscopy to be performed for  erosive esophagitis and benign esophageal stricture   Risks, benefits, limitations, and alternatives regarding  endoscopy have been reviewed with the patient.  Questions have been answered.  All parties agreeable.   Sherri Sear, MD   02/02/2022, 11:50 AM

## 2022-02-02 NOTE — Transfer of Care (Signed)
Immediate Anesthesia Transfer of Care Note  Patient: Gilbert Lee  Procedure(s) Performed: ESOPHAGOGASTRODUODENOSCOPY (EGD) WITH BIOPSY (Mouth)  Patient Location: PACU  Anesthesia Type: General  Level of Consciousness: awake, alert  and patient cooperative  Airway and Oxygen Therapy: Patient Spontanous Breathing and Patient connected to supplemental oxygen  Post-op Assessment: Post-op Vital signs reviewed, Patient's Cardiovascular Status Stable, Respiratory Function Stable, Patent Airway and No signs of Nausea or vomiting  Post-op Vital Signs: Reviewed and stable  Complications: No notable events documented.

## 2022-02-03 ENCOUNTER — Encounter: Payer: Self-pay | Admitting: Gastroenterology

## 2022-02-03 ENCOUNTER — Other Ambulatory Visit: Payer: Self-pay | Admitting: Gastroenterology

## 2022-02-05 IMAGING — DX DG CHEST 2V
2 series · 2 of 2 positions shown · non-contrast
Comparison: None.

CLINICAL DATA: Productive cough, shortness of breath, wheezing,
fever

EXAM:
CHEST - 2 VIEW

[chest pa]
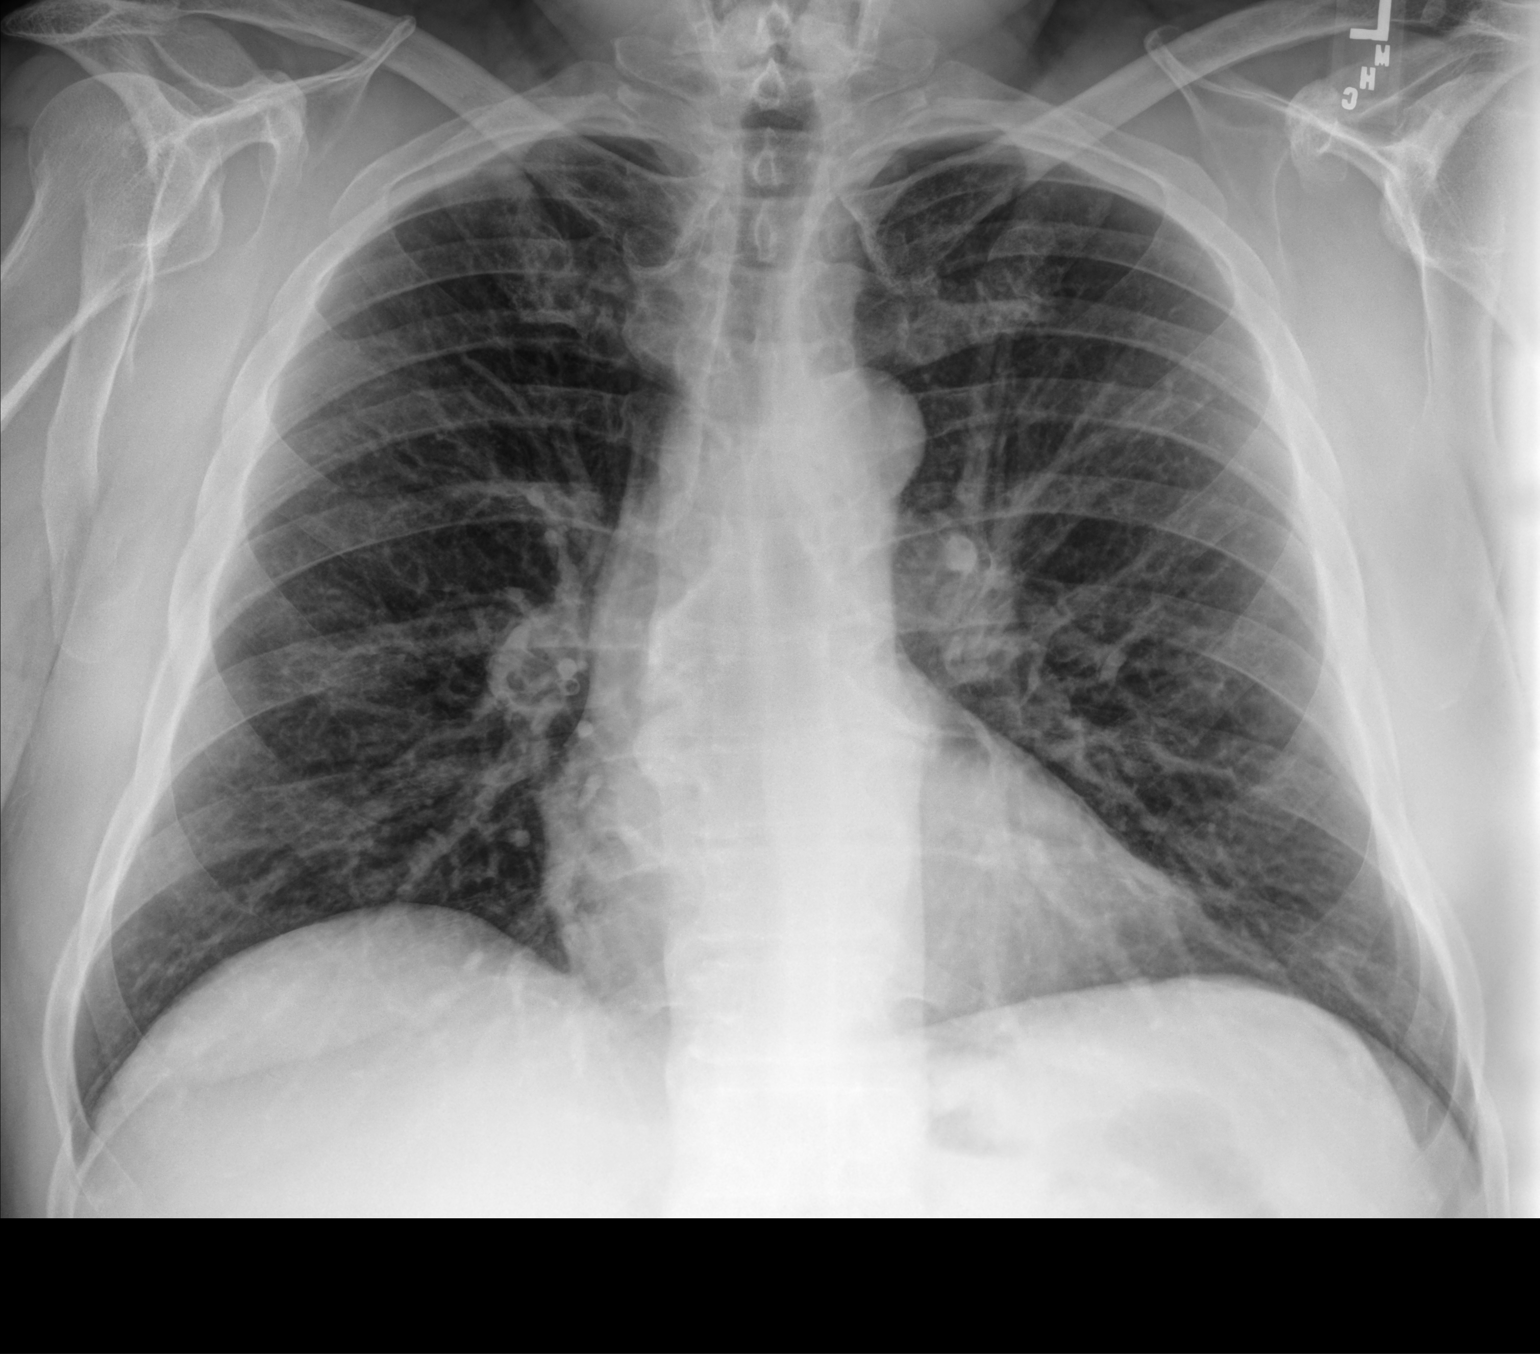

[chest lat]
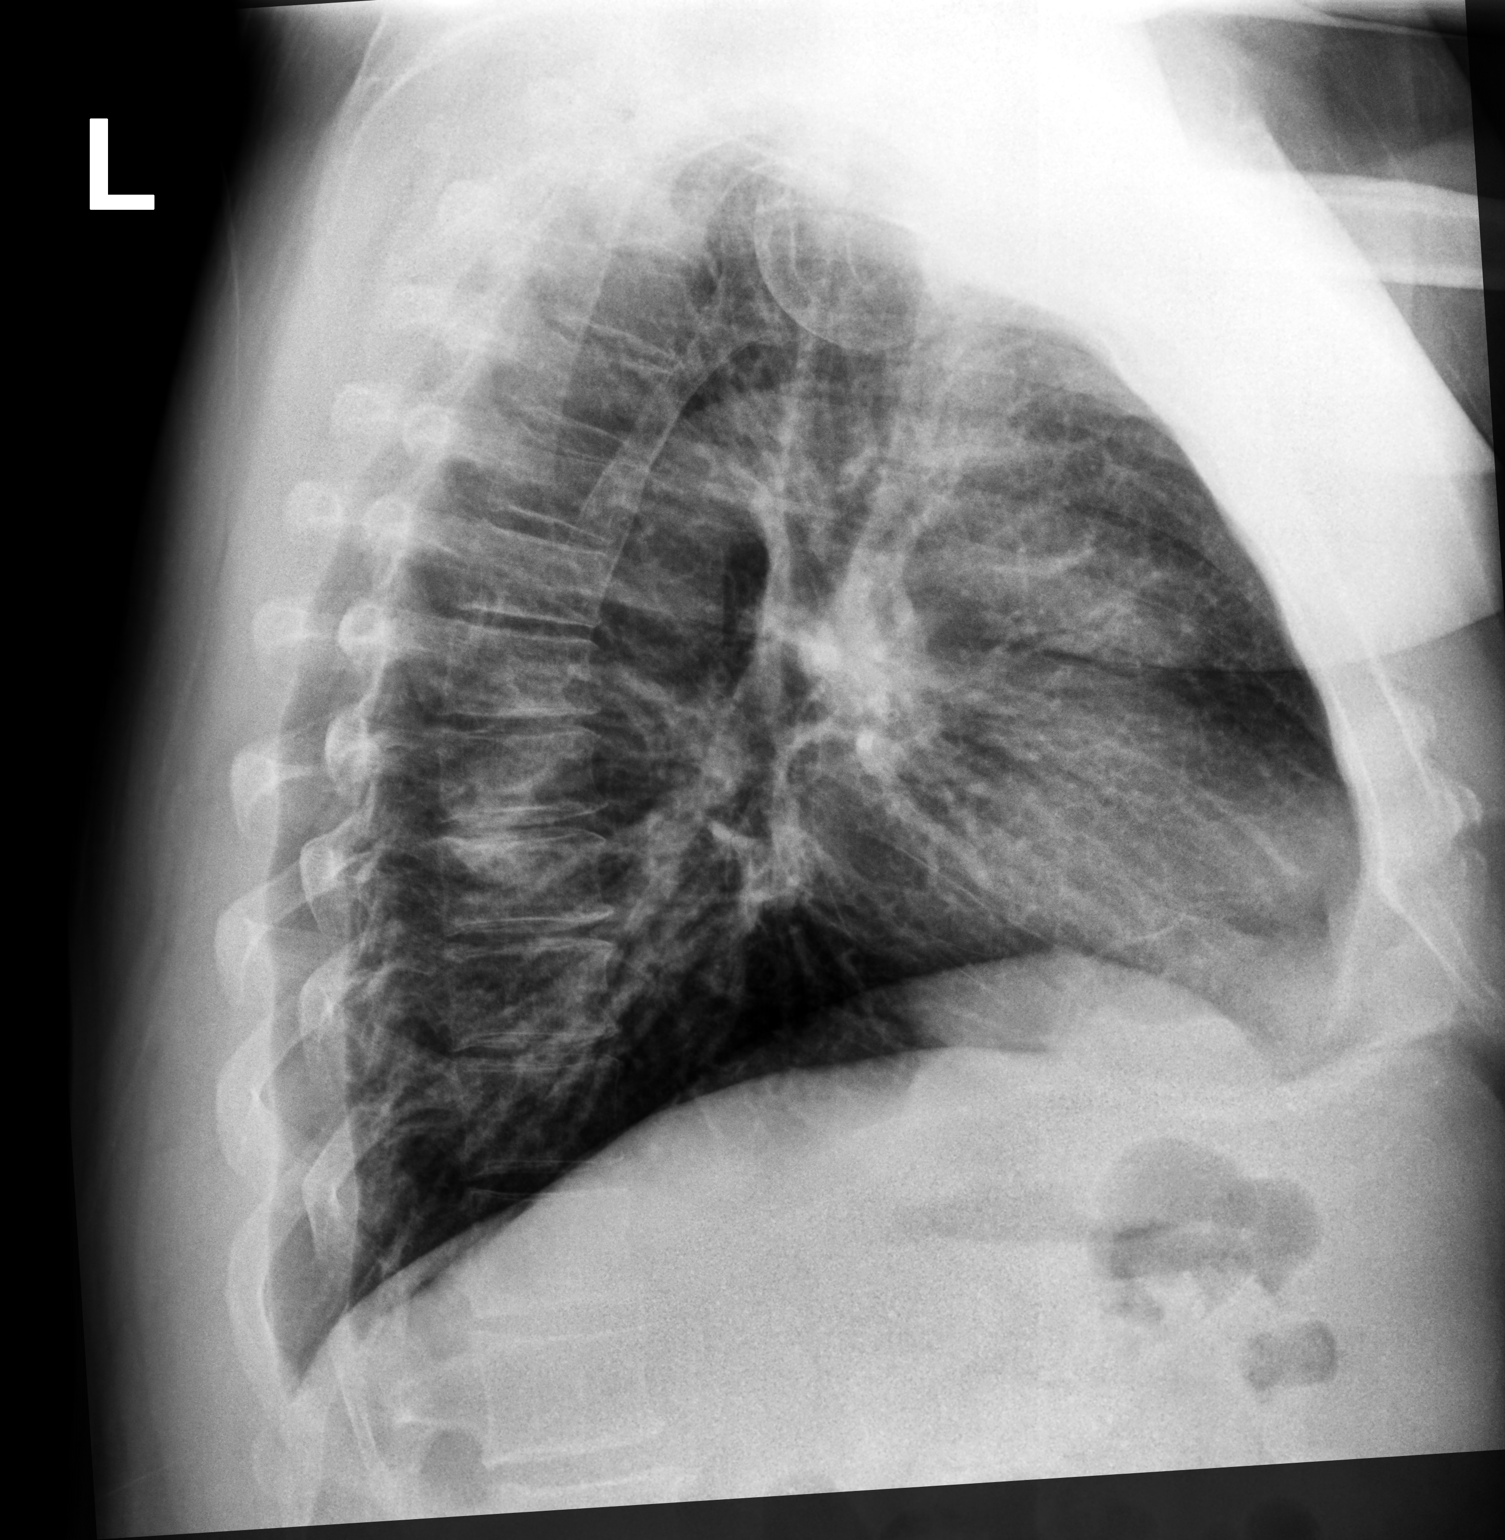

[2 of 2 positions shown; findings below may reference images not displayed]

FINDINGS: The heart size and mediastinal contours are within normal limits.
Both lungs are clear. The visualized skeletal structures are
unremarkable.
IMPRESSION: No acute abnormality of the lungs.

## 2022-02-06 LAB — SURGICAL PATHOLOGY

## 2022-02-14 ENCOUNTER — Other Ambulatory Visit: Payer: Self-pay | Admitting: Family Medicine

## 2022-02-14 DIAGNOSIS — E291 Testicular hypofunction: Secondary | ICD-10-CM

## 2022-03-12 ENCOUNTER — Other Ambulatory Visit: Payer: Self-pay | Admitting: Gastroenterology

## 2022-03-16 ENCOUNTER — Telehealth: Payer: Self-pay | Admitting: Gastroenterology

## 2022-03-16 ENCOUNTER — Other Ambulatory Visit: Payer: Self-pay

## 2022-03-16 MED ORDER — OMEPRAZOLE 40 MG PO CPDR: 40.0000 mg | DELAYED_RELEASE_CAPSULE | Freq: Every day | ORAL | 2 refills | Status: DC

## 2022-03-16 NOTE — Telephone Encounter (Signed)
Patient states that Dr Marius Ditch told him to take omeprazole once daily instead of twice daily. He states that the pharmacy has his old prescription for twice daily and they need verification for a new prescription.

## 2022-03-16 NOTE — Telephone Encounter (Signed)
Updated Prescription for Omeprazole '40mg'$  has been sent to [facility] per patient request. Pt notified this was sent.     Please update the prescription to omeprazole 40 mg once a day before breakfast, 90-day supply with 2 refills  RV

## 2022-07-28 ENCOUNTER — Telehealth: Payer: Self-pay | Admitting: Family Medicine

## 2022-07-28 NOTE — Telephone Encounter (Signed)
[facility] requesting prescription refill allopurinol (ZYLOPRIM) 100 MG tablet   Please advise

## 2022-07-31 ENCOUNTER — Other Ambulatory Visit: Payer: Self-pay

## 2022-07-31 DIAGNOSIS — E790 Hyperuricemia without signs of inflammatory arthritis and tophaceous disease: Secondary | ICD-10-CM

## 2022-07-31 MED ORDER — ALLOPURINOL 100 MG PO TABS: ORAL_TABLET | ORAL | 4 refills | Status: DC

## 2022-08-22 ENCOUNTER — Telehealth: Payer: Self-pay | Admitting: Family Medicine

## 2022-08-22 ENCOUNTER — Other Ambulatory Visit: Payer: Self-pay

## 2022-08-22 MED ORDER — ALBUTEROL SULFATE HFA 108 (90 BASE) MCG/ACT IN AERS: 1.0000 | INHALATION_SPRAY | Freq: Four times a day (QID) | RESPIRATORY_TRACT | 1 refills | Status: DC | PRN

## 2022-08-22 NOTE — Telephone Encounter (Signed)
[facility] pharmacy faxed refill request for the following medications:  albuterol (VENTOLIN HFA) 108 (90 Base) MCG/ACT inhaler  Please advise  

## 2022-08-25 ENCOUNTER — Other Ambulatory Visit: Payer: Self-pay | Admitting: Family Medicine

## 2022-08-25 DIAGNOSIS — E790 Hyperuricemia without signs of inflammatory arthritis and tophaceous disease: Secondary | ICD-10-CM

## 2022-08-25 MED ORDER — ALLOPURINOL 300 MG PO TABS: ORAL_TABLET | ORAL | 2 refills | Status: DC

## 2022-08-25 NOTE — Telephone Encounter (Signed)
Medication Refill - Medication: allopurinol (ZYLOPRIM) 300 MG tablet   Has the patient contacted their pharmacy? Yes.   (Agent: If no, request that the patient contact the pharmacy for the refill. If patient does not wish to contact the pharmacy document the reason why and proceed with request.) (Agent: If yes, when and what did the pharmacy advise?)  Preferred Pharmacy (with phone number or street name):  [facility] [id number] [place], [place] - [address] Phone: [phone]  Fax: [phone]     Has the patient been seen for an appointment in the last year OR does the patient have an upcoming appointment? Yes.    Agent: Please be advised that RX refills may take up to 3 business days. We ask that you follow-up with your pharmacy.

## 2022-08-25 NOTE — Telephone Encounter (Signed)
Requested Prescriptions  Pending Prescriptions Disp Refills   allopurinol (ZYLOPRIM) 300 MG tablet 90 tablet 2    Sig: TAKE 1 TABLET BY MOUTH DAILY ALONG WITH 100MG  TABLET FOR TOTAL DAILY DOSE OF 400MG      Endocrinology:  Gout Agents - allopurinol Failed - 08/25/2022  5:45 PM      Failed - CBC within normal limits and completed in the last 12 months    WBC  Date Value Ref Range Status  11/02/2021 7.5 3.4 - 10.8 x10E3/uL Final  11/13/2018 11.0 (H) 4.0 - 10.5 K/uL Final   RBC  Date Value Ref Range Status  11/02/2021 5.41 4.14 - 5.80 x10E6/uL Final  11/13/2018 4.52 4.22 - 5.81 MIL/uL Final   Hemoglobin  Date Value Ref Range Status  11/02/2021 15.2 13.0 - 17.7 g/dL Final   Hematocrit  Date Value Ref Range Status  11/02/2021 46.1 37.5 - 51.0 % Final   MCHC  Date Value Ref Range Status  11/02/2021 33.0 31.5 - 35.7 g/dL Final  82/95/6213 08.6 30.0 - 36.0 g/dL Final   [facility]  Date Value Ref Range Status  11/02/2021 28.1 26.6 - 33.0 pg Final  11/13/2018 30.3 26.0 - 34.0 pg Final   MCV  Date Value Ref Range Status  11/02/2021 85 79 - 97 fL Final   No results found for: "PLTCOUNTKUC", "LABPLAT", "POCPLA" RDW  Date Value Ref Range Status  11/02/2021 14.4 11.6 - 15.4 % Final         Passed - Uric Acid in normal range and within 360 days    Uric Acid  Date Value Ref Range Status  11/02/2021 5.4 3.8 - 8.4 mg/dL Final    Comment:               Therapeutic target for gout patients: <6.0         Passed - Cr in normal range and within 360 days    Creatinine, Ser  Date Value Ref Range Status  11/02/2021 1.16 0.76 - 1.27 mg/dL Final         Passed - Valid encounter within last 12 months    Recent Outpatient Visits           9 months ago Hyperlipidemia, unspecified hyperlipidemia type   [facility], [facility]   1 year ago Hypogonadism in male   [facility] Malva Limes, MD   2 years ago Hyperlipidemia,  unspecified hyperlipidemia type   [facility] Malva Limes, MD   3 years ago Hyperlipidemia, unspecified hyperlipidemia type   [facility] Malva Limes, MD   4 years ago Hyperlipidemia, unspecified hyperlipidemia type   [facility] Malva Limes, MD

## 2022-09-09 ENCOUNTER — Other Ambulatory Visit: Payer: Self-pay | Admitting: Family Medicine

## 2022-09-09 DIAGNOSIS — E291 Testicular hypofunction: Secondary | ICD-10-CM

## 2022-09-11 NOTE — Telephone Encounter (Signed)
Requested medications are due for refill today.  yes  Requested medications are on the active medications list.  yes  Last refill. 02/14/2022 10 mL 3 rf  Future visit scheduled.   no  Notes to clinic.  Refill not delegated.    Requested Prescriptions  Pending Prescriptions Disp Refills   testosterone cypionate (DEPOTESTOSTERONE CYPIONATE) 200 MG/ML injection [Pharmacy Med Name: TESTOSTERONE CYP 200 MG/ML] 4 mL     Sig: INJECT 0.5 MLS INTRAMUSCULARLY EVERY 7 DAYS     Off-Protocol Failed - 09/09/2022  6:52 AM      Failed - Medication not assigned to a protocol, review manually.      Passed - Valid encounter within last 12 months    Recent Outpatient Visits           10 months ago Hyperlipidemia, unspecified hyperlipidemia type   [facility] Malva Limes, MD   1 year ago Hypogonadism in male   [facility] Malva Limes, MD   2 years ago Hyperlipidemia, unspecified hyperlipidemia type   [facility] Malva Limes, MD   3 years ago Hyperlipidemia, unspecified hyperlipidemia type   [facility] Malva Limes, MD   4 years ago Hyperlipidemia, unspecified hyperlipidemia type   [facility] Malva Limes, MD

## 2022-09-13 ENCOUNTER — Other Ambulatory Visit: Payer: Self-pay | Admitting: Family Medicine

## 2022-09-13 DIAGNOSIS — E291 Testicular hypofunction: Secondary | ICD-10-CM

## 2022-09-13 NOTE — Telephone Encounter (Signed)
Medication Refill - Medication: testosterone cypionate (DEPOTESTOSTERONE CYPIONATE) 200 MG/ML injection   Has the patient contacted their pharmacy? Yes.    Preferred Pharmacy (with phone number or street name):  [facility] [id number] [place], [place] - [address] Phone: [phone]  Fax: [phone]     Has the patient been seen for an appointment in the last year OR does the patient have an upcoming appointment? No.  Agent: Please be advised that RX refills may take up to 3 business days. We ask that you follow-up with your pharmacy.

## 2022-09-14 NOTE — Telephone Encounter (Signed)
Refilled 09/13/22 4 ml with 2 refills. Requested Prescriptions  Refused Prescriptions Disp Refills   testosterone cypionate (DEPOTESTOSTERONE CYPIONATE) 200 MG/ML injection 10 mL 3     Off-Protocol Failed - 09/13/2022 11:17 AM      Failed - Medication not assigned to a protocol, review manually.      Passed - Valid encounter within last 12 months    Recent Outpatient Visits           10 months ago Hyperlipidemia, unspecified hyperlipidemia type   [facility] Malva Limes, MD   1 year ago Hypogonadism in male   [facility] Malva Limes, MD   2 years ago Hyperlipidemia, unspecified hyperlipidemia type   [facility] Malva Limes, MD   3 years ago Hyperlipidemia, unspecified hyperlipidemia type   [facility] Malva Limes, MD   4 years ago Hyperlipidemia, unspecified hyperlipidemia type   [facility] Malva Limes, MD

## 2022-12-04 ENCOUNTER — Other Ambulatory Visit: Payer: Self-pay | Admitting: Family Medicine

## 2022-12-04 DIAGNOSIS — E291 Testicular hypofunction: Secondary | ICD-10-CM

## 2022-12-04 NOTE — Telephone Encounter (Signed)
[facility] faxed refill request for the following medications:   testosterone cypionate (DEPOTESTOSTERONE CYPIONATE) 200 MG/ML injection    Please advise.

## 2022-12-05 MED ORDER — TESTOSTERONE CYPIONATE 200 MG/ML IM SOLN: INTRAMUSCULAR | 0 refills | Status: DC

## 2022-12-11 ENCOUNTER — Encounter: Payer: Self-pay | Admitting: Family Medicine

## 2022-12-11 ENCOUNTER — Ambulatory Visit: Payer: BC Managed Care – PPO | Admitting: Family Medicine

## 2022-12-11 VITALS — BP 140/95 | HR 74 | Ht 72.0 in | Wt 270.3 lb

## 2022-12-11 DIAGNOSIS — F419 Anxiety disorder, unspecified: Secondary | ICD-10-CM

## 2022-12-11 DIAGNOSIS — E790 Hyperuricemia without signs of inflammatory arthritis and tophaceous disease: Secondary | ICD-10-CM

## 2022-12-11 DIAGNOSIS — E785 Hyperlipidemia, unspecified: Secondary | ICD-10-CM

## 2022-12-11 DIAGNOSIS — R7303 Prediabetes: Secondary | ICD-10-CM

## 2022-12-11 DIAGNOSIS — Z23 Encounter for immunization: Secondary | ICD-10-CM | POA: Diagnosis not present

## 2022-12-11 DIAGNOSIS — E291 Testicular hypofunction: Secondary | ICD-10-CM | POA: Diagnosis not present

## 2022-12-11 MED ORDER — BUSPIRONE HCL 5 MG PO TABS: ORAL_TABLET | ORAL | 1 refills | Status: DC

## 2022-12-11 NOTE — Patient Instructions (Signed)
Please review the attached list of medications and notify my office if there are any errors.   Cut back on alcohol and caffeine, both of this can make stress levels worse

## 2022-12-11 NOTE — Progress Notes (Signed)
Established patient visit   Patient: [facility]   DOB: 03/19/1966   57 y.o. Male  MRN: [id number] Visit Date: 12/11/2022  Today's healthcare provider: Mila Merry, MD   Chief Complaint  Patient presents with   Medical Management of Chronic Issues    Medication refills, would also like to discuss anxiety that has elevated    Hypertension   Subjective    Discussed the use of AI scribe software for clinical note transcription with the patient, who gave verbal consent to proceed.  History of Present Illness   The patient, a 57 year old with a history of gout, low testosterone, high cholesterol, and a hiatal hernia, presents for routine follow up and with concerns about anxiety and sleep disturbances. He reports feeling stressed and anxious for no apparent reason, often fearing the worst in situations. This anxiety is not affecting his work but is causing sleep disturbances. He reports waking up in the middle of the night and feeling tired in the afternoon. The patient also discusses his lifestyle habits. He has been exercising regularly, including treadmill workouts and weight lifting. He has also switched from sugary soft drinks to unsweetened tea and coffee, although he acknowledges that his caffeine intake has increased as a result. He also consumes alcohol a couple of nights a week but is considering reducing this.  In addition to anxiety, the patient reports a recent mistake in his testosterone dosage. He was inadvertently injecting double the prescribed dose (1 milliliter instead of 0.5 milliliters) for approximately two to three months. This led to symptoms such as hot flashes and acne. He has since corrected the dosage and is back to injecting 0.5 milliliters once a week.  Finally, the patient expresses a desire to reduce his medication intake. He is currently on allopurinol, rosuvastatin, and omeprazole. He is particularly concerned about the potential memory effects of  rosuvastatin.       Medications: Outpatient Medications Prior to Visit  Medication Sig   albuterol (VENTOLIN HFA) 108 (90 Base) MCG/ACT inhaler Inhale 1-2 puffs into the lungs every 6 (six) hours as needed for shortness of breath.   allopurinol (ZYLOPRIM) 100 MG tablet TAKE ONE TABLET BY MOUTH DAILY WITH 300MG  TABLET   allopurinol (ZYLOPRIM) 300 MG tablet TAKE 1 TABLET BY MOUTH DAILY ALONG WITH 100MG  TABLET FOR TOTAL DAILY DOSE OF 400MG    aspirin EC 81 MG tablet Take 81 mg by mouth daily. Swallow whole.   b complex vitamins capsule Take 1 capsule by mouth daily.   Cholecalciferol (VITAMIN D3) 50 MCG (2000 UT) TABS Take 2,000 Units by mouth daily.    Coenzyme Q10 (COQ10) 100 MG CAPS Take 1 capsule by mouth daily.   Insulin Syringe-Needle U-100 (B-D INSULIN SYRINGE 1CC/25GX1") 25G X 1" 1 ML MISC Use once every 2 weeks for testosterone injection.   MAGNESIUM PO Take by mouth.   MELATONIN PO Take 3 mg by mouth at bedtime as needed (Sleep).    omeprazole (PRILOSEC) 40 MG capsule Take 1 capsule (40 mg total) by mouth daily. before breakfast   rosuvastatin (CRESTOR) 20 MG tablet TAKE 1 TABLET BY MOUTH DAILY   testosterone cypionate (DEPOTESTOSTERONE CYPIONATE) 200 MG/ML injection INJECT 0.5 MLS INTRAMUSCULARLY EVERY 7 DAYS   TURMERIC PO Take by mouth.   zinc gluconate 50 MG tablet Take 50 mg by mouth daily.   No facility-administered medications prior to visit.      Objective    BP (!) 140/95 (BP Location: Right Arm,  Patient Position: Sitting, Cuff Size: Large)   Pulse 74   Ht 6' (1.829 m)   Wt 270 lb 4.8 oz (122.6 kg)   SpO2 95%   BMI 36.66 kg/m   Physical Exam  General appearance: Mildly obese male, cooperative and in no acute distress Head: Normocephalic, without obvious abnormality, atraumatic Respiratory: Respirations even and unlabored, normal respiratory rate Extremities: All extremities are intact.  Skin: Skin color, texture, turgor normal. No rashes seen  Psych:  Appropriate mood and affect. Neurologic: Mental status: Alert, oriented to person, place, and time, thought content appropriate.   Assessment & Plan        Testosterone Replacement Therapy Self-reported increase in testosterone dose from 0.25ml to 1ml weekly for the past 2-3 months, leading to symptoms of hyperandrogenism including acne and hot flashes. Returned to prescribed dose of 0.80ml weekly last Wednesday. -Continue testosterone at 0.46ml weekly. -Check testosterone levels next Wednesday before the next injection to assess trough levels.  Anxiety Reports increased anxiety and stress, affecting sleep and daily function, multifactorial. No history of depression. -Prescribe Buspirone, starting with nightly dose and titrating up as needed. -Reduce alcohol and caffeine consumption. Consider reducing B vitamin supplement as it may contribute to anxiety.  Hyperlipidemia Currently on Rosuvastatin 20mg , expressed concern about potential memory issues and desire to reduce medication load. -Consider reducing Rosuvastatin to 10mg  depending on upcoming cholesterol levels.     Flu vaccine given today      Mila Merry, MD  [facility] [phone] (phone) [phone] (fax)  [facility]

## 2022-12-19 ENCOUNTER — Other Ambulatory Visit: Payer: Self-pay | Admitting: Family Medicine

## 2022-12-19 DIAGNOSIS — E790 Hyperuricemia without signs of inflammatory arthritis and tophaceous disease: Secondary | ICD-10-CM | POA: Diagnosis not present

## 2022-12-19 DIAGNOSIS — R7303 Prediabetes: Secondary | ICD-10-CM | POA: Diagnosis not present

## 2022-12-19 DIAGNOSIS — E291 Testicular hypofunction: Secondary | ICD-10-CM

## 2022-12-19 DIAGNOSIS — E785 Hyperlipidemia, unspecified: Secondary | ICD-10-CM | POA: Diagnosis not present

## 2022-12-20 ENCOUNTER — Other Ambulatory Visit: Payer: Self-pay | Admitting: Family Medicine

## 2022-12-20 DIAGNOSIS — E291 Testicular hypofunction: Secondary | ICD-10-CM

## 2022-12-20 NOTE — Telephone Encounter (Unsigned)
Copied from CRM [id number]. Topic: General - Other >> Dec 20, 2022  5:20 PM Everette C wrote: Reason for CRM: Medication Refill - Medication: testosterone cypionate (DEPOTESTOSTERONE CYPIONATE) 200 MG/ML injection [[id number]]  Has the patient contacted their pharmacy? Yes.   (Agent: If no, request that the patient contact the pharmacy for the refill. If patient does not wish to contact the pharmacy document the reason why and proceed with request.) (Agent: If yes, when and what did the pharmacy advise?)  Preferred Pharmacy (with phone number or street name): [facility] [id number] [place], [place] - [address] [place] [address] Phone: [phone] Fax: [phone] Hours: Not open 24 hours   Has the patient been seen for an appointment in the last year OR does the patient have an upcoming appointment? Yes.    Agent: Please be advised that RX refills may take up to 3 business days. We ask that you follow-up with your pharmacy.

## 2022-12-20 NOTE — Telephone Encounter (Signed)
Requested medication (s) are due for refill today: Yes  Requested medication (s) are on the active medication list: Yes  Last refill:  12/05/22  Future visit scheduled: No  Notes to clinic:  Manual review.    Requested Prescriptions  Pending Prescriptions Disp Refills   testosterone cypionate (DEPOTESTOSTERONE CYPIONATE) 200 MG/ML injection [Pharmacy Med Name: TESTOSTERONE CYP 200 MG/ML] 2 mL     Sig: INJECT 0.5 MLS INTRAMUSCULARLY EVERY 7 DAYS **NEED OFFICE VISIT FOR REFILLS**     Off-Protocol Failed - 12/19/2022  8:32 PM      Failed - Medication not assigned to a protocol, review manually.      Passed - Valid encounter within last 12 months    Recent Outpatient Visits           1 week ago Need for influenza vaccination   [facility] Malva Limes, MD   1 year ago Hyperlipidemia, unspecified hyperlipidemia type   [facility] Malva Limes, MD   2 years ago Hypogonadism in male   [facility] Malva Limes, MD   2 years ago Hyperlipidemia, unspecified hyperlipidemia type   [facility] Malva Limes, MD   3 years ago Hyperlipidemia, unspecified hyperlipidemia type   [facility] Malva Limes, MD

## 2022-12-21 ENCOUNTER — Other Ambulatory Visit: Payer: Self-pay | Admitting: Family Medicine

## 2022-12-21 ENCOUNTER — Telehealth: Payer: Self-pay

## 2022-12-21 DIAGNOSIS — E291 Testicular hypofunction: Secondary | ICD-10-CM

## 2022-12-21 NOTE — Telephone Encounter (Signed)
Requested medications are due for refill today.  yes  Requested medications are on the active medications list.  yes  Last refill. 12/05/2022 2mL 0 rf  Future visit scheduled.   no  Notes to clinic.  Please review for refill. Pt was told to only use medicine bottle 1 time and dispose to prevent infection. Pt has missed his shot for this week. He usually take the shot on weds    Requested Prescriptions  Pending Prescriptions Disp Refills   testosterone cypionate (DEPOTESTOSTERONE CYPIONATE) 200 MG/ML injection 2 mL 0    Sig: INJECT 0.5 MLS INTRAMUSCULARLY EVERY 7 DAYS     Off-Protocol Failed - 12/21/2022  5:27 PM      Failed - Medication not assigned to a protocol, review manually.      Passed - Valid encounter within last 12 months    Recent Outpatient Visits           1 week ago Need for influenza vaccination   [facility] Malva Limes, MD   1 year ago Hyperlipidemia, unspecified hyperlipidemia type   [facility] Malva Limes, MD   2 years ago Hypogonadism in male   [facility] Malva Limes, MD   2 years ago Hyperlipidemia, unspecified hyperlipidemia type   [facility] Malva Limes, MD   3 years ago Hyperlipidemia, unspecified hyperlipidemia type   [facility] Malva Limes, MD

## 2022-12-21 NOTE — Telephone Encounter (Signed)
Medication Refill - Medication: testosterone cypionate (DEPOTESTOSTERONE CYPIONATE) 200 MG/ML injection   Has the patient contacted their pharmacy? Yes.    Preferred Pharmacy (with phone number or street name):  [facility] [id number] [place], [place] - [address] Phone: [phone]  Fax: [phone]     Has the patient been seen for an appointment in the last year OR does the patient have an upcoming appointment? Yes.    Agent: Please be advised that RX refills may take up to 3 business days. We ask that you follow-up with your pharmacy.

## 2022-12-21 NOTE — Telephone Encounter (Signed)
Requested medication (s) are due for refill today: Yes  Requested medication (s) are on the active medication list: Yes  Last refill:  12/05/22  Future visit scheduled: No  Notes to clinic:  Unable to refill per protocol, appointment needed.      Requested Prescriptions  Pending Prescriptions Disp Refills   testosterone cypionate (DEPOTESTOSTERONE CYPIONATE) 200 MG/ML injection 2 mL 0    Sig: INJECT 0.5 MLS INTRAMUSCULARLY EVERY 7 DAYS     Off-Protocol Failed - 12/20/2022  5:27 PM      Failed - Medication not assigned to a protocol, review manually.      Passed - Valid encounter within last 12 months    Recent Outpatient Visits           1 week ago Need for influenza vaccination   [facility] Malva Limes, MD   1 year ago Hyperlipidemia, unspecified hyperlipidemia type   [facility] Malva Limes, MD   2 years ago Hypogonadism in male   [facility] Malva Limes, MD   2 years ago Hyperlipidemia, unspecified hyperlipidemia type   [facility] Malva Limes, MD   3 years ago Hyperlipidemia, unspecified hyperlipidemia type   [facility] Malva Limes, MD

## 2022-12-21 NOTE — Telephone Encounter (Signed)
Received call from pt. His refill request for testosterone was denied, with the reason that he needed an appt. Pt was seen 12/11/2022. Pt usually take this shot on weds. Pt did not have the medication and so was not able to take his shot. Pt states that he only gets 1 dose out of each bottle.  Pt was told not to use bottle for 2nd dose, in order to avoid infection.  Please review for refill.

## 2022-12-22 ENCOUNTER — Telehealth: Payer: Self-pay | Admitting: Family Medicine

## 2022-12-22 LAB — TESTOSTERONE,FREE AND TOTAL
Testosterone, Free: 9.3 pg/mL (ref 7.2–24.0)
Testosterone: 531 ng/dL (ref 264–916)

## 2022-12-22 LAB — CBC
Hematocrit: 48.7 % (ref 37.5–51.0)
Hemoglobin: 15.1 g/dL (ref 13.0–17.7)
MCH: 27.4 pg (ref 26.6–33.0)
MCHC: 31 g/dL — ABNORMAL LOW (ref 31.5–35.7)
MCV: 88 fL (ref 79–97)
Platelets: 247 10*3/uL (ref 150–450)
RBC: 5.52 x10E6/uL (ref 4.14–5.80)
RDW: 13.6 % (ref 11.6–15.4)
WBC: 7 10*3/uL (ref 3.4–10.8)

## 2022-12-22 LAB — COMPREHENSIVE METABOLIC PANEL
ALT: 38 [IU]/L (ref 0–44)
AST: 29 [IU]/L (ref 0–40)
Albumin: 4.1 g/dL (ref 3.8–4.9)
Alkaline Phosphatase: 51 [IU]/L (ref 44–121)
BUN/Creatinine Ratio: 11 (ref 9–20)
BUN: 13 mg/dL (ref 6–24)
Bilirubin Total: 0.4 mg/dL (ref 0.0–1.2)
CO2: 25 mmol/L (ref 20–29)
Calcium: 9.3 mg/dL (ref 8.7–10.2)
Chloride: 103 mmol/L (ref 96–106)
Creatinine, Ser: 1.16 mg/dL (ref 0.76–1.27)
Globulin, Total: 2.6 g/dL (ref 1.5–4.5)
Glucose: 106 mg/dL — ABNORMAL HIGH (ref 70–99)
Potassium: 4.4 mmol/L (ref 3.5–5.2)
Sodium: 141 mmol/L (ref 134–144)
Total Protein: 6.7 g/dL (ref 6.0–8.5)
eGFR: 73 mL/min/{1.73_m2} (ref >59)

## 2022-12-22 LAB — LIPID PANEL
Chol/HDL Ratio: 4.3 {ratio} (ref 0.0–5.0)
Cholesterol, Total: 154 mg/dL (ref 100–199)
HDL: 36 mg/dL — ABNORMAL LOW (ref >39)
LDL Chol Calc (NIH): 92 mg/dL (ref 0–99)
Triglycerides: 145 mg/dL (ref 0–149)
VLDL Cholesterol Cal: 26 mg/dL (ref 5–40)

## 2022-12-22 LAB — HEMOGLOBIN A1C
Est. average glucose Bld gHb Est-mCnc: 126 mg/dL
Hgb A1c MFr Bld: 6 % — ABNORMAL HIGH (ref 4.8–5.6)

## 2022-12-22 LAB — TSH: TSH: 0.963 u[IU]/mL (ref 0.450–4.500)

## 2022-12-22 LAB — URIC ACID: Uric Acid: 5.1 mg/dL (ref 3.8–8.4)

## 2022-12-22 MED ORDER — TESTOSTERONE CYPIONATE 200 MG/ML IM SOLN: INTRAMUSCULAR | 5 refills | Status: DC

## 2022-12-22 NOTE — Telephone Encounter (Signed)
Disp Refills Start End   testosterone cypionate (DEPOTESTOSTERONE CYPIONATE) 200 MG/ML injection 2 mL 5 12/22/2022 --   Sig: INJECT 0.5 MLS INTRAMUSCULARLY EVERY 7 DAYS   Sent to pharmacy as: testosterone cypionate (DEPOTESTOSTERONE CYPIONATE) 200 MG/ML injection   E-Prescribing Status: Receipt confirmed by pharmacy (12/22/2022 12:02 PM EDT)

## 2022-12-22 NOTE — Telephone Encounter (Signed)
Pt is calling to receive an update on medication refill from yesterday on testosterone cypionate (DEPOTESTOSTERONE CYPIONATE) 200 MG/ML injection [[id number]] . Pt reports that he had an appt last week. Please advise Cb- [phone]

## 2022-12-25 ENCOUNTER — Encounter: Payer: Self-pay | Admitting: Family Medicine

## 2022-12-27 ENCOUNTER — Other Ambulatory Visit: Payer: Self-pay

## 2022-12-27 MED ORDER — TESTOSTERONE CYPIONATE 100 MG/ML IM SOLN: 100.0000 mg | INTRAMUSCULAR | 0 refills | Status: DC

## 2022-12-27 NOTE — Telephone Encounter (Signed)
2nd Request per patient.....12/27/2022..please follow up  Received call from pt. His refill request for testosterone was denied, with the reason that he needed an appt. Pt was seen 12/11/2022. Pt usually take this shot on weds. Pt did not have the medication and so was not able to take his shot. Pt states that he only gets 1 dose out of each bottle.  Pt was told not to use bottle for 2nd dose, in order to avoid infection.  Please review for refill.

## 2022-12-27 NOTE — Telephone Encounter (Signed)
Have sent prescription for 100mg /ml to [facility]. The 100 mg/ml only comes in 10mL vials, so it will last several months.

## 2022-12-28 ENCOUNTER — Telehealth: Payer: Self-pay | Admitting: Family Medicine

## 2022-12-28 NOTE — Telephone Encounter (Signed)
Please review

## 2022-12-28 NOTE — Telephone Encounter (Signed)
The 10ml vial is a multidose vial. The 100mg /ml dose only comes in 10 ml multidose vials. The 200mg /ml dose comes in single dose vials, but he only needs 100mg  per dose.   I don't know what he wants. His insurance company won't cover 2 single dose vials per month. To the best of my knowledge, he is either going to have to inject 1 ml of the 100mg /ml multi dose vial every 2 weeks, or he change to the 200mg  single dose vials and do one injection each month

## 2022-12-28 NOTE — Telephone Encounter (Signed)
Darl Pikes from [facility] called stated the script for testosterone cypionate (DEPOTESTOTERONE CYPIONATE) 100 MG/ML injection needs to be rewritten to say 4ml for a mth supply, she stated it is 200mg  per ml and patient is to inject 1/2 ml every 7 days. Please f/u with pharmacy.

## 2022-12-28 NOTE — Telephone Encounter (Signed)
[facility] needs clarification on dosage for  testosterone cypionate. Please advise Darl Pikes at [facility].

## 2022-12-28 NOTE — Telephone Encounter (Signed)
Inject 1 mL (100 mg total) into the muscle every 14 (fourteen) days. For IM use only Dispense: 10 mL, Refills: 0 ordered

## 2022-12-30 MED ORDER — TESTOSTERONE CYPIONATE 200 MG/ML IM SOLN: 100.0000 mg | INTRAMUSCULAR | 0 refills | Status: DC

## 2022-12-30 NOTE — Telephone Encounter (Signed)
Have sent new prescription for 200/ml multidose formulation to [facility]

## 2022-12-31 ENCOUNTER — Other Ambulatory Visit: Payer: Self-pay | Admitting: Family Medicine

## 2022-12-31 DIAGNOSIS — E291 Testicular hypofunction: Secondary | ICD-10-CM

## 2022-12-31 DIAGNOSIS — F419 Anxiety disorder, unspecified: Secondary | ICD-10-CM

## 2023-01-01 ENCOUNTER — Telehealth: Payer: Self-pay | Admitting: Family Medicine

## 2023-01-01 MED ORDER — TESTOSTERONE CYPIONATE 200 MG/ML IM SOLN: 100.0000 mg | INTRAMUSCULAR | 5 refills | Status: DC

## 2023-01-01 NOTE — Telephone Encounter (Signed)
Received a fax from covermymeds for Testosterone Cypionate 200mg /ml  Key: [id number]

## 2023-01-01 NOTE — Telephone Encounter (Signed)
The directions for testosterone:   Testosterone cypionate 200 mg/ml injection   Inject 0.5 mls intramuscularly every 7 days. 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

## 2023-02-09 ENCOUNTER — Encounter: Payer: Self-pay | Admitting: Family Medicine

## 2023-02-09 DIAGNOSIS — M25511 Pain in right shoulder: Secondary | ICD-10-CM | POA: Diagnosis not present

## 2023-02-09 DIAGNOSIS — M7521 Bicipital tendinitis, right shoulder: Secondary | ICD-10-CM | POA: Diagnosis not present

## 2023-02-09 DIAGNOSIS — S46211A Strain of muscle, fascia and tendon of other parts of biceps, right arm, initial encounter: Secondary | ICD-10-CM | POA: Diagnosis not present

## 2023-02-16 DIAGNOSIS — B9689 Other specified bacterial agents as the cause of diseases classified elsewhere: Secondary | ICD-10-CM | POA: Diagnosis not present

## 2023-02-16 DIAGNOSIS — J209 Acute bronchitis, unspecified: Secondary | ICD-10-CM | POA: Diagnosis not present

## 2023-02-16 DIAGNOSIS — Z03818 Encounter for observation for suspected exposure to other biological agents ruled out: Secondary | ICD-10-CM | POA: Diagnosis not present

## 2023-02-16 DIAGNOSIS — J019 Acute sinusitis, unspecified: Secondary | ICD-10-CM | POA: Diagnosis not present

## 2023-03-04 ENCOUNTER — Other Ambulatory Visit: Payer: Self-pay | Admitting: Gastroenterology

## 2023-03-05 NOTE — Telephone Encounter (Signed)
Has not been seen since 01/25/2022 needs appointment. Will give 30 days and sent mychart message

## 2023-03-31 ENCOUNTER — Other Ambulatory Visit: Payer: Self-pay | Admitting: Gastroenterology

## 2023-04-02 ENCOUNTER — Other Ambulatory Visit: Payer: Self-pay | Admitting: Family Medicine

## 2023-04-02 NOTE — Telephone Encounter (Signed)
 Prescription previously filled by Toney Reil, MD. Prescription is pended. Please review and sign if appropriate.

## 2023-04-02 NOTE — Telephone Encounter (Signed)
 When fax came in Dr. Verdis Prime name was marked out and Dr. Sherrie Mustache name was on it.

## 2023-04-02 NOTE — Telephone Encounter (Signed)
[facility] pharmacy faxed refill request for the following medications:    omeprazole (PRILOSEC) 40 MG capsule   Please advise  

## 2023-04-03 MED ORDER — OMEPRAZOLE 40 MG PO CPDR: 40.0000 mg | DELAYED_RELEASE_CAPSULE | Freq: Every day | ORAL | 1 refills | Status: DC

## 2023-04-24 ENCOUNTER — Telehealth: Payer: Self-pay | Admitting: Family Medicine

## 2023-04-24 DIAGNOSIS — E785 Hyperlipidemia, unspecified: Secondary | ICD-10-CM

## 2023-04-24 MED ORDER — ROSUVASTATIN CALCIUM 20 MG PO TABS: 20.0000 mg | ORAL_TABLET | Freq: Every day | ORAL | 4 refills | Status: DC

## 2023-04-24 NOTE — Telephone Encounter (Signed)
 [facility] refill request for the following medications:   rosuvastatin (CRESTOR) 20 MG tablet   Please advise.

## 2023-04-24 NOTE — Telephone Encounter (Signed)
Was his dose reduced to 10 mg? Making sure before sending prescription in of 20 mg.

## 2023-05-19 ENCOUNTER — Other Ambulatory Visit: Payer: Self-pay | Admitting: Family Medicine

## 2023-05-19 DIAGNOSIS — E790 Hyperuricemia without signs of inflammatory arthritis and tophaceous disease: Secondary | ICD-10-CM

## 2023-06-17 ENCOUNTER — Other Ambulatory Visit: Payer: Self-pay | Admitting: Family Medicine

## 2023-06-17 DIAGNOSIS — E291 Testicular hypofunction: Secondary | ICD-10-CM

## 2023-06-20 DIAGNOSIS — J310 Chronic rhinitis: Secondary | ICD-10-CM | POA: Diagnosis not present

## 2023-06-20 DIAGNOSIS — J3489 Other specified disorders of nose and nasal sinuses: Secondary | ICD-10-CM | POA: Diagnosis not present

## 2023-06-20 DIAGNOSIS — G4733 Obstructive sleep apnea (adult) (pediatric): Secondary | ICD-10-CM | POA: Diagnosis not present

## 2023-07-12 ENCOUNTER — Other Ambulatory Visit: Payer: Self-pay | Admitting: Family Medicine

## 2023-07-12 DIAGNOSIS — E291 Testicular hypofunction: Secondary | ICD-10-CM

## 2023-08-13 ENCOUNTER — Other Ambulatory Visit: Payer: Self-pay | Admitting: Family Medicine

## 2023-08-13 DIAGNOSIS — E790 Hyperuricemia without signs of inflammatory arthritis and tophaceous disease: Secondary | ICD-10-CM

## 2023-08-25 ENCOUNTER — Other Ambulatory Visit: Payer: Self-pay | Admitting: Family Medicine

## 2023-08-25 DIAGNOSIS — E790 Hyperuricemia without signs of inflammatory arthritis and tophaceous disease: Secondary | ICD-10-CM

## 2023-08-31 ENCOUNTER — Other Ambulatory Visit: Payer: Self-pay | Admitting: Family Medicine

## 2023-08-31 DIAGNOSIS — E790 Hyperuricemia without signs of inflammatory arthritis and tophaceous disease: Secondary | ICD-10-CM

## 2023-08-31 NOTE — Telephone Encounter (Unsigned)
 Copied from CRM [id number]. Topic: Clinical - Medication Refill >> Aug 31, 2023 10:57 AM Stanly Early wrote: Medication: allopurinol  (ZYLOPRIM ) 100 MG tablet(it was denied)   Has the patient contacted their pharmacy? Yes (Agent: If no, request that the patient contact the pharmacy for the refill. If patient does not wish to contact the pharmacy document the reason why and proceed with request.) (Agent: If yes, when and what did the pharmacy advise?)  This is the patient's preferred pharmacy:  [facility] [id number] [place], [place] - [address] [place] [address] Phone: [phone] Fax: [phone]  Is this the correct pharmacy for this prescription? Yes If no, delete pharmacy and type the correct one.   Has the prescription been filled recently? No  Is the patient out of the medication? Yes  Has the patient been seen for an appointment in the last year OR does the patient have an upcoming appointment? No  Can we respond through MyChart? Yes  Agent: Please be advised that Rx refills may take up to 3 business days. We ask that you follow-up with your pharmacy.

## 2023-09-03 MED ORDER — ALLOPURINOL 100 MG PO TABS: ORAL_TABLET | ORAL | 0 refills | Status: DC

## 2023-09-03 NOTE — Telephone Encounter (Signed)
 Requested Prescriptions  Pending Prescriptions Disp Refills   allopurinol  (ZYLOPRIM ) 100 MG tablet 90 tablet 0    Sig: TAKE ONE TABLET BY MOUTH DAILY WITH 300MG  TABLET     Endocrinology:  Gout Agents - allopurinol  Failed - 09/03/2023  2:49 PM      Failed - Valid encounter within last 12 months    Recent Outpatient Visits   None            Failed - CBC within normal limits and completed in the last 12 months    WBC  Date Value Ref Range Status  12/19/2022 7.0 3.4 - 10.8 x10E3/uL Final  11/13/2018 11.0 (H) 4.0 - 10.5 K/uL Final   RBC  Date Value Ref Range Status  12/19/2022 5.52 4.14 - 5.80 x10E6/uL Final  11/13/2018 4.52 4.22 - 5.81 MIL/uL Final   Hemoglobin  Date Value Ref Range Status  12/19/2022 15.1 13.0 - 17.7 g/dL Final   Hematocrit  Date Value Ref Range Status  12/19/2022 48.7 37.5 - 51.0 % Final   MCHC  Date Value Ref Range Status  12/19/2022 31.0 (L) 31.5 - 35.7 g/dL Final  16/12/9602 54.0 30.0 - 36.0 g/dL Final   [facility]  Date Value Ref Range Status  12/19/2022 27.4 26.6 - 33.0 pg Final  11/13/2018 30.3 26.0 - 34.0 pg Final   MCV  Date Value Ref Range Status  12/19/2022 88 79 - 97 fL Final   No results found for: PLTCOUNTKUC, LABPLAT, POCPLA RDW  Date Value Ref Range Status  12/19/2022 13.6 11.6 - 15.4 % Final         Passed - Uric Acid in normal range and within 360 days    Uric Acid  Date Value Ref Range Status  12/19/2022 5.1 3.8 - 8.4 mg/dL Final    Comment:               Therapeutic target for gout patients: <6.0         Passed - Cr in normal range and within 360 days    Creatinine, Ser  Date Value Ref Range Status  12/19/2022 1.16 0.76 - 1.27 mg/dL Final

## 2023-09-05 ENCOUNTER — Ambulatory Visit: Admitting: Family Medicine

## 2023-09-15 ENCOUNTER — Other Ambulatory Visit: Payer: Self-pay | Admitting: Family Medicine

## 2023-09-15 DIAGNOSIS — E790 Hyperuricemia without signs of inflammatory arthritis and tophaceous disease: Secondary | ICD-10-CM

## 2023-09-26 ENCOUNTER — Other Ambulatory Visit: Payer: Self-pay | Admitting: Family Medicine

## 2023-10-12 ENCOUNTER — Other Ambulatory Visit: Payer: Self-pay | Admitting: Family Medicine

## 2023-10-12 DIAGNOSIS — E790 Hyperuricemia without signs of inflammatory arthritis and tophaceous disease: Secondary | ICD-10-CM

## 2023-10-20 ENCOUNTER — Other Ambulatory Visit: Payer: Self-pay | Admitting: Family Medicine

## 2023-10-20 DIAGNOSIS — E790 Hyperuricemia without signs of inflammatory arthritis and tophaceous disease: Secondary | ICD-10-CM

## 2023-10-29 ENCOUNTER — Ambulatory Visit: Admitting: Family Medicine

## 2023-10-29 ENCOUNTER — Encounter: Payer: Self-pay | Admitting: Family Medicine

## 2023-10-29 VITALS — BP 114/72 | HR 92 | Temp 98.9°F | Ht 71.5 in | Wt 263.5 lb

## 2023-10-29 DIAGNOSIS — R7303 Prediabetes: Secondary | ICD-10-CM | POA: Diagnosis not present

## 2023-10-29 DIAGNOSIS — E790 Hyperuricemia without signs of inflammatory arthritis and tophaceous disease: Secondary | ICD-10-CM

## 2023-10-29 DIAGNOSIS — Z125 Encounter for screening for malignant neoplasm of prostate: Secondary | ICD-10-CM

## 2023-10-29 DIAGNOSIS — K2100 Gastro-esophageal reflux disease with esophagitis, without bleeding: Secondary | ICD-10-CM

## 2023-10-29 DIAGNOSIS — M791 Myalgia, unspecified site: Secondary | ICD-10-CM

## 2023-10-29 DIAGNOSIS — E291 Testicular hypofunction: Secondary | ICD-10-CM

## 2023-10-29 DIAGNOSIS — E785 Hyperlipidemia, unspecified: Secondary | ICD-10-CM | POA: Diagnosis not present

## 2023-10-29 NOTE — Progress Notes (Signed)
 Established patient visit   Patient: Gilbert Lee   DOB: 07/27/1965   58 y.o. Male  MRN: [id number] Visit Date: 10/29/2023  Today's healthcare provider: Nancyann Perry, MD   Chief Complaint  Patient presents with   Medication Refill    Patient states that he was told that he needed an office visit in order to receive more refills.  Needs refills on some medications also has requested an EKG.  Patient declined all vaccines.   Subjective    Discussed the use of AI scribe software for clinical note transcription with the patient, who gave verbal consent to proceed.  History of Present Illness   Gilbert Lee is a 58 year old male with hypertension, hypogonadism, hyperlipidemia, and gout who presents for medication management and follow-up.  His reports his weight has decreased from 278 pounds to 260 pounds at home. He attributes his weight loss to an improved diet and reduced alcohol consumption, specifically beer and wine, which he believes contributed to his anxiety. He plans to increase his exercise regimen once he reaches 250 pounds.  He is currently taking 400 mg of allopurinol  and 20 mg of rosuvastatin . He questions whether he can reduce these doses as he experiences muscle aches, which he suspects are side effects of rosuvastatin . He has not had a gout flare since starting allopurinol . He has a history of a hiatal hernia for which he takes omeprazole . He wants to reduce his medication intake as he ages, citing anxiety about aging and medication use.  He reports a history of shoulder injuries from lifting weights, which have impeded his ability to exercise and contributed to a plateau in his weight loss. He describes a strain in both biceps, with the right being worse, and notes that this is separate from the general muscle aches he attributes to medication.  He is on testosterone  therapy, taking half an mL once a week, and notes some acne on the back of his neck and legs,  which he associates with the therapy. He mentions a history of low testosterone  levels, which have since normalized.  No recent chest pain during exercise. He has not been able to meet his push-up goals due to shoulder issues.     Wt Readings from Last 5 Encounters:  10/29/23 263 lb 8 oz (119.5 kg)  12/11/22 270 lb 4.8 oz (122.6 kg)  02/02/22 265 lb (120.2 kg)  11/01/21 273 lb (123.8 kg)  07/15/21 265 lb (120.2 kg)   Lab Results  Component Value Date   CHOL 154 12/19/2022   HDL 36 (L) 12/19/2022   LDLCALC 92 12/19/2022   TRIG 145 12/19/2022   CHOLHDL 4.3 12/19/2022   Lab Results  Component Value Date   HGBA1C 6.0 (H) 12/19/2022   Lab Results  Component Value Date   LABURIC 5.1 12/19/2022     Medications: Outpatient Medications Prior to Visit  Medication Sig   albuterol  (VENTOLIN  HFA) 108 (90 Base) MCG/ACT inhaler Inhale 1-2 puffs into the lungs every 6 (six) hours as needed for shortness of breath.   allopurinol  (ZYLOPRIM ) 100 MG tablet TAKE ONE TABLET BY MOUTH DAILY WITH 300MG  TABLET   allopurinol  (ZYLOPRIM ) 300 MG tablet TAKE 1 TABLET BY MOUTH DAILY WITH 100MG  TABLET   aspirin EC 81 MG tablet Take 81 mg by mouth daily. Swallow whole.   b complex vitamins capsule Take 1 capsule by mouth daily.   Cholecalciferol (VITAMIN D3) 50 MCG (2000 UT) TABS Take 2,000 Units  by mouth daily.    Coenzyme Q10 (COQ10) 100 MG CAPS Take 1 capsule by mouth daily.   Insulin  Syringe-Needle U-100 (B-D INSULIN  SYRINGE 1CC/25GX1) 25G X 1 1 ML MISC Use once every 2 weeks for testosterone  injection.   MAGNESIUM PO Take by mouth.   MELATONIN PO Take 3 mg by mouth at bedtime as needed (Sleep).    omeprazole  (PRILOSEC) 40 MG capsule TAKE 1 CAPSULE BY MOUTH DAILY BEFORE BREAKFAST   rosuvastatin  (CRESTOR ) 20 MG tablet Take 1 tablet (20 mg total) by mouth daily.   testosterone  cypionate (DEPOTESTOSTERONE CYPIONATE) 200 MG/ML injection INJECT 0.5ML INTO THE MUSCLE EVERY 7 DAYS .DISCARD REMAINDER OF VIAL    TURMERIC PO Take by mouth.   zinc gluconate 50 MG tablet Take 50 mg by mouth daily.   busPIRone  (BUSPAR ) 10 MG tablet Take 1 tablet (10 mg total) by mouth 2 (two) times daily.   No facility-administered medications prior to visit.   Review of Systems  Constitutional:  Negative for appetite change, chills and fever.  Respiratory:  Negative for chest tightness, shortness of breath and wheezing.   Cardiovascular:  Negative for chest pain and palpitations.  Gastrointestinal:  Negative for abdominal pain, nausea and vomiting.       Objective    BP 114/72 (BP Location: Left Arm, Patient Position: Sitting, Cuff Size: Large)   Pulse 92   Temp 98.9 F (37.2 C) (Oral)   Ht 5' 11.5 (1.816 m)   Wt 263 lb 8 oz (119.5 kg)   SpO2 100%   BMI 36.24 kg/m   Physical Exam   General: Appearance:    Mildly obese male in no acute distress  Eyes:    PERRL, conjunctiva/corneas clear, EOM's intact       Lungs:     Clear to auscultation bilaterally, respirations unlabored  Heart:    Normal heart rate. Normal rhythm. No murmurs, rubs, or gallops.    MS:   All extremities are intact.    Neurologic:   Awake, alert, oriented x 3. No apparent focal neurological defect.          Assessment & Plan    1. Hypogonadism in male (Primary)  Improved on current dose of testosterone  injection, currently taking 1/2 x 200mg  once a week.   2. Hyperlipidemia, unspecified hyperlipidemia type Feeling achy in joints and muscles and concerned may be affect of statin. Has been consuming much healthy diet and interested in reducing dose of rosuvastatin .  - Lipid panel - Lipoprotein A (LPA) - Apolipoprotein B  3. Myalgia  - CK  4. Prediabetes  - Hemoglobin A1c  5. Hyperuricemia No gout flares on 400mg  allopurinol , but has improved diet and interested in reducing dose.  - Uric acid  6. Gastroesophageal reflux disease with esophagitis without hemorrhage Well controlled on current PPI  7. Prostate cancer  screening  - PSA Total (Reflex To Free)      Nancyann Perry, MD  [facility] [phone] (phone) [phone] (fax)  [facility]

## 2023-10-30 ENCOUNTER — Ambulatory Visit: Payer: Self-pay | Admitting: Family Medicine

## 2023-10-30 NOTE — Progress Notes (Signed)
 Called [facility]. Test order has been added, Testosterone , Free, Direct With Total Testosterone  ([id number] test order number)

## 2023-10-31 LAB — PSA TOTAL (REFLEX TO FREE): Prostate Specific Ag, Serum: 1.6 ng/mL (ref 0.0–4.0)

## 2023-10-31 LAB — CBC
Hematocrit: 48.1 % (ref 37.5–51.0)
Hemoglobin: 15 g/dL (ref 13.0–17.7)
MCH: 27.7 pg (ref 26.6–33.0)
MCHC: 31.2 g/dL — ABNORMAL LOW (ref 31.5–35.7)
MCV: 89 fL (ref 79–97)
Platelets: 255 x10E3/uL (ref 150–450)
RBC: 5.41 x10E6/uL (ref 4.14–5.80)
RDW: 14.5 % (ref 11.6–15.4)
WBC: 8.9 x10E3/uL (ref 3.4–10.8)

## 2023-10-31 LAB — HEMOGLOBIN A1C
Est. average glucose Bld gHb Est-mCnc: 120 mg/dL
Hgb A1c MFr Bld: 5.8 % — ABNORMAL HIGH (ref 4.8–5.6)

## 2023-10-31 LAB — LIPOPROTEIN A (LPA): Lipoprotein (a): 108.7 nmol/L — ABNORMAL HIGH (ref <75.0)

## 2023-10-31 LAB — CK: Total CK: 214 U/L (ref 41–331)

## 2023-10-31 LAB — APOLIPOPROTEIN B: Apolipoprotein B: 99 mg/dL — ABNORMAL HIGH (ref <90)

## 2023-10-31 LAB — LIPID PANEL
Chol/HDL Ratio: 4.3 ratio (ref 0.0–5.0)
Cholesterol, Total: 175 mg/dL (ref 100–199)
HDL: 41 mg/dL (ref >39)
LDL Chol Calc (NIH): 104 mg/dL — ABNORMAL HIGH (ref 0–99)
Triglycerides: 170 mg/dL — ABNORMAL HIGH (ref 0–149)
VLDL Cholesterol Cal: 30 mg/dL (ref 5–40)

## 2023-10-31 LAB — URIC ACID: Uric Acid: 4.7 mg/dL (ref 3.8–8.4)

## 2023-11-02 ENCOUNTER — Other Ambulatory Visit: Payer: Self-pay | Admitting: Family Medicine

## 2023-11-02 ENCOUNTER — Ambulatory Visit: Attending: Otolaryngology

## 2023-11-02 DIAGNOSIS — G4733 Obstructive sleep apnea (adult) (pediatric): Secondary | ICD-10-CM | POA: Diagnosis not present

## 2023-11-02 DIAGNOSIS — R0683 Snoring: Secondary | ICD-10-CM | POA: Diagnosis not present

## 2023-11-15 ENCOUNTER — Other Ambulatory Visit: Payer: Self-pay | Admitting: Family Medicine

## 2023-11-15 DIAGNOSIS — E790 Hyperuricemia without signs of inflammatory arthritis and tophaceous disease: Secondary | ICD-10-CM

## 2023-11-20 ENCOUNTER — Ambulatory Visit: Attending: Otolaryngology

## 2023-11-20 DIAGNOSIS — G4733 Obstructive sleep apnea (adult) (pediatric): Secondary | ICD-10-CM | POA: Insufficient documentation

## 2023-11-20 DIAGNOSIS — R0683 Snoring: Secondary | ICD-10-CM | POA: Diagnosis not present

## 2023-11-21 LAB — TESTOSTERONE,FREE AND TOTAL
Testosterone, Free: 11.8 pg/mL (ref 7.2–24.0)
Testosterone: 695 ng/dL (ref 264–916)

## 2023-11-21 LAB — SPECIMEN STATUS REPORT

## 2023-12-03 ENCOUNTER — Encounter: Payer: Self-pay | Admitting: Family Medicine

## 2023-12-04 MED ORDER — ATORVASTATIN CALCIUM 10 MG PO TABS
10.0000 mg | ORAL_TABLET | Freq: Every day | ORAL | 1 refills | Status: DC
Start: 2023-12-04 — End: 2024-01-16

## 2023-12-05 ENCOUNTER — Other Ambulatory Visit: Payer: Self-pay | Admitting: Family Medicine

## 2023-12-05 DIAGNOSIS — E291 Testicular hypofunction: Secondary | ICD-10-CM

## 2023-12-07 DIAGNOSIS — G4733 Obstructive sleep apnea (adult) (pediatric): Secondary | ICD-10-CM | POA: Diagnosis not present

## 2024-01-16 ENCOUNTER — Encounter: Payer: Self-pay | Admitting: Family Medicine

## 2024-01-16 DIAGNOSIS — E785 Hyperlipidemia, unspecified: Secondary | ICD-10-CM

## 2024-01-16 MED ORDER — ROSUVASTATIN CALCIUM 20 MG PO TABS: 20.0000 mg | ORAL_TABLET | Freq: Every day | ORAL | 4 refills | Status: AC

## 2024-02-11 ENCOUNTER — Encounter: Payer: Self-pay | Admitting: Family Medicine

## 2024-03-22 ENCOUNTER — Other Ambulatory Visit: Payer: Self-pay | Admitting: Family Medicine

## 2024-03-31 ENCOUNTER — Ambulatory Visit
Admission: EM | Admit: 2024-03-31 | Discharge: 2024-03-31 | Disposition: A | Discharge status: Home / Self Care | Attending: Family Medicine | Admitting: Family Medicine

## 2024-03-31 ENCOUNTER — Encounter: Payer: Self-pay | Admitting: Emergency Medicine

## 2024-03-31 DIAGNOSIS — J209 Acute bronchitis, unspecified: Secondary | ICD-10-CM

## 2024-03-31 MED ORDER — PROMETHAZINE-DM 6.25-15 MG/5ML PO SYRP: 5.0000 mL | ORAL_SOLUTION | Freq: Four times a day (QID) | ORAL | 0 refills | Status: AC | PRN

## 2024-03-31 MED ORDER — ALBUTEROL SULFATE HFA 108 (90 BASE) MCG/ACT IN AERS: 1.0000 | INHALATION_SPRAY | Freq: Four times a day (QID) | RESPIRATORY_TRACT | 0 refills | Status: AC | PRN

## 2024-03-31 MED ORDER — AZITHROMYCIN 250 MG PO TABS: 250.0000 mg | ORAL_TABLET | Freq: Every day | ORAL | 0 refills | Status: AC

## 2024-03-31 NOTE — ED Provider Notes (Signed)
 " Gilbert Lee    CSN: [id number] Arrival date & time: 03/31/24  0948      History   Chief Complaint Chief Complaint  Patient presents with   Cough    HPI Gilbert Lee is a 59 y.o. male  presents for evaluation of URI symptoms for 7 days. Patient reports associated symptoms of cough, congestion, postnasal drip. Denies N/V/D, fevers, sinus pressure/pain, ear pain, sore throat, body aches or shortness of breath. Patient does have a hx of asthma.  Has not needed to use his inhaler since onset.  Does need a refill.  Patient is not an active smoker.     Pt has taken Mucinex OTC for symptoms. Pt has no other concerns at this time.    Cough   Past Medical History:  Diagnosis Date   Arthritis    Asthma    well controlled   Esophageal stricture    GERD (gastroesophageal reflux disease)    Gout    History of hiatal hernia    Pre-diabetes     Patient Active Problem List   Diagnosis Date Noted   Hiatal hernia with GERD without esophagitis 02/02/2022   Prediabetes 11/03/2021   History of hyperplastic colonic polyp    Hypogonadism in male 01/19/2021   History of asthma 07/28/2019   Gastroesophageal reflux disease with esophagitis without hemorrhage    Esophageal dysphagia    Foreign body in esophagus    Gouty arthropathy, chronic, 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   Obesity (BMI 30-39.9) 03/28/2017   Hyperuricemia 02/14/2016   Allergic rhinitis 09/06/2015   Airway hyperreactivity 09/06/2015   Family history of malignant neoplasm of prostate 09/06/2015   Arthritis urica 09/06/2015   Hyperlipidemia 09/06/2015   Hemorrhoids, internal 09/06/2015    Past Surgical History:  Procedure Laterality Date   COLONOSCOPY WITH PROPOFOL  N/A 11/11/2015   Procedure: COLONOSCOPY WITH PROPOFOL ;  Surgeon: Rogelia Copping, MD;  Location: [facility];  Service: Endoscopy;  Laterality: N/A;   COLONOSCOPY WITH PROPOFOL  N/A 07/15/2021   Procedure: COLONOSCOPY WITH PROPOFOL ;  Surgeon: Unk Corinn Skiff, MD;  Location: [facility];  Service: Gastroenterology;  Laterality: N/A;   ESOPHAGOGASTRODUODENOSCOPY (EGD) WITH PROPOFOL  N/A 11/14/2018   Procedure: ESOPHAGOGASTRODUODENOSCOPY (EGD) WITH PROPOFOL ;  Surgeon: Unk Corinn Skiff, MD;  Location: [facility] END[facility];  Service: Gastroenterology;  Laterality: N/A;   ESOPHAGOGASTRODUODENOSCOPY (EGD) WITH PROPOFOL  N/A 04/09/2019   Procedure: ESOPHAGOGASTRODUODENOSCOPY (EGD) WITH PROPOFOL ;  Surgeon: Unk Corinn Skiff, MD;  Location: [facility] END[facility];  Service: Gastroenterology;  Laterality: N/A;   ESOPHAGOGASTRODUODENOSCOPY (EGD) WITH PROPOFOL  N/A 02/02/2022   Procedure: ESOPHAGOGASTRODUODENOSCOPY (EGD) WITH BIOPSY;  Surgeon: Unk Corinn Skiff, MD;  Location: [facility];  Service: Endoscopy;  Laterality: N/A;   KNEE ARTHROSCOPY Right    ORIF TOE FRACTURE Left 11/07/2019   Procedure: OPEN REDUCTION INTERNAL FIXATION (ORIF) METATARSAL (TOE) FRACTURE LEFT 5TH;  Surgeon: Ashley Soulier, DPM;  Location: [facility];  Service: Podiatry;  Laterality: Left;   POLYPECTOMY N/A 11/11/2015   Procedure: POLYPECTOMY;  Surgeon: Rogelia Copping, MD;  Location: [facility];  Service: Endoscopy;  Laterality: N/A;       Home Medications    Prior to Admission medications  Medication Sig Start Date End Date Taking? Authorizing Provider  albuterol  (VENTOLIN  HFA) 108 (90 Base) MCG/ACT inhaler Inhale 1-2 puffs into the lungs every 6 (six) hours as needed for wheezing or shortness of breath. 03/31/24  Yes Ottis Vacha, Jodi R, NP  azithromycin  (ZITHROMAX ) 250 MG tablet Take 1 tablet (250 mg total) by  mouth daily. Take first 2 tablets together, then 1 every day until finished. 04/03/24  Yes Keylen Eckenrode, Jodi R, NP  promethazine -dextromethorphan (PROMETHAZINE -DM) 6.25-15 MG/5ML syrup Take 5 mLs by mouth 4 (four) times daily as needed for cough. 03/31/24  Yes Cathyann Kilfoyle, Jodi R, NP  allopurinol  (ZYLOPRIM ) 300 MG tablet TAKE 1 TABLET BY MOUTH DAILY WITH 100MG  TABLET 11/15/23    Gasper Nancyann BRAVO, MD  aspirin EC 81 MG tablet Take 81 mg by mouth daily. Swallow whole.    [provider]  b complex vitamins capsule Take 1 capsule by mouth daily.    [provider]  Cholecalciferol (VITAMIN D3) 50 MCG (2000 UT) TABS Take 2,000 Units by mouth daily.     [provider]  Coenzyme Q10 (COQ10) 100 MG CAPS Take 1 capsule by mouth daily.    [provider]  Insulin  Syringe-Needle U-100 (B-D INSULIN  SYRINGE 1CC/25GX1) 25G X 1 1 ML MISC Use once every 2 weeks for testosterone  injection. 12/21/20   Gasper Nancyann BRAVO, MD  MAGNESIUM PO Take by mouth.    [provider]  MELATONIN PO Take 3 mg by mouth at bedtime as needed (Sleep).     [provider]  omeprazole  (PRILOSEC) 40 MG capsule TAKE 1 CAPSULE BY MOUTH DAILY BEFORE BREAKFAST 03/22/24   Gasper Nancyann BRAVO, MD  rosuvastatin  (CRESTOR ) 20 MG tablet Take 1 tablet (20 mg total) by mouth daily. 01/16/24   Gasper Nancyann BRAVO, MD  testosterone  cypionate (DEPOTESTOSTERONE CYPIONATE) 200 MG/ML injection INJECT 0.5ML INTO THE MUSCLE EVERY 7 DAYS AND DISCARD REMAINDER OF VIAL 12/06/23   Gasper Nancyann BRAVO, MD  TURMERIC PO Take by mouth.    [provider]  zinc gluconate 50 MG tablet Take 50 mg by mouth daily.    [provider]    Family History Family History  Problem Relation Age of Onset   Other Father        intestinal obstruction   Prostate cancer Father    Parkinsonism Mother    Deep vein thrombosis Brother    Deep vein thrombosis Brother     Social History Social History[1]   Allergies   Patient has no known allergies.   Review of Systems Review of Systems  HENT:  Positive for congestion and postnasal drip.   Respiratory:  Positive for cough.      Physical Exam Triage Vital Signs ED Triage Vitals  Encounter Vitals Group     BP 03/31/24 1045 137/75     Girls Systolic BP Percentile --      Girls Diastolic BP Percentile --      Boys Systolic BP  Percentile --      Boys Diastolic BP Percentile --      Pulse Rate 03/31/24 1045 74     Resp 03/31/24 1045 20     Temp 03/31/24 1045 98.4 F (36.9 C)     Temp Source 03/31/24 1045 Oral     SpO2 03/31/24 1045 96 %     Weight --      Height --      Head Circumference --      Peak Flow --      Pain Score 03/31/24 1047 0     Pain Loc --      Pain Education --      Exclude from Growth Chart --    No data found.  Updated Vital Signs BP 137/75 (BP Location: Right Arm)   Pulse 74   Temp  98.4 F (36.9 C) (Oral)   Resp 20   SpO2 96%   Visual Acuity Right Eye Distance:   Left Eye Distance:   Bilateral Distance:    Right Eye Near:   Left Eye Near:    Bilateral Near:     Physical Exam Vitals and nursing note reviewed.  Constitutional:      General: He is not in acute distress.    Appearance: Normal appearance. He is not ill-appearing or toxic-appearing.  HENT:     Head: Normocephalic and atraumatic.     Right Ear: Tympanic membrane and ear canal normal.     Left Ear: Tympanic membrane and ear canal normal.     Nose: Congestion present.     Mouth/Throat:     Mouth: Mucous membranes are moist.     Pharynx: No posterior oropharyngeal erythema.  Eyes:     Pupils: Pupils are equal, round, and reactive to light.  Cardiovascular:     Rate and Rhythm: Normal rate and regular rhythm.     Heart sounds: Normal heart sounds.  Pulmonary:     Effort: Pulmonary effort is normal.     Breath sounds: Normal breath sounds. No wheezing, rhonchi or rales.  Musculoskeletal:     Cervical back: Normal range of motion and neck supple.  Lymphadenopathy:     Cervical: No cervical adenopathy.  Skin:    General: Skin is warm and dry.  Neurological:     General: No focal deficit present.     Mental Status: He is alert and oriented to person, place, and time.  Psychiatric:        Mood and Affect: Mood normal.        Behavior: Behavior normal.      UC Treatments / Results  Labs (all  labs ordered are listed, but only abnormal results are displayed) Labs Reviewed - No data to display  EKG   Radiology No results found.  Procedures Procedures (including critical care time)  Medications Ordered in UC Medications - No data to display  Initial Impression / Assessment and Plan / UC Course  I have reviewed the triage vital signs and the nursing notes.  Pertinent labs & imaging results that were available during my care of the patient were reviewed by me and considered in my medical decision making (see chart for details).     Reviewed exam and symptoms with patient.  No red flags.  Discussed bronchitis.  Refilled albuterol  inhaler to use as needed for wheezing or shortness of breath.  He has Flonase  at home and advised to take 30 minutes before bed to help with his postnasal drip/cough.  Promethazine  DM as needed for cough, side effect profile reviewed.  Provisional prescription for azithromycin  provided with instruction not to take unless symptoms do not improve by January 15 and he verbalized understanding.  Encourage rest fluids and PCP follow-up 2 to 3 days for recheck.  ER precautions reviewed. Final Clinical Impressions(s) / UC Diagnoses   Final diagnoses:  Acute bronchitis, unspecified organism     Discharge Instructions      I refilled your albuterol  inhaler to use as needed for any shortness of breath.  Start Promethazine  DM as needed for your cough.  This will make you drowsy so you may take prior to bedtime if needed.  No drinking or driving while on this medication.  Start Flonase  30 minutes before bed to help with your postnasal drip/cough.  Lots of rest and fluids.  A  provisional prescription for azithromycin  has been provided.  Please do not take unless your symptoms do not improve  by January 15.  Follow-up with your PCP in 2 to 3 days for recheck.  Please go to the ER if you develop any worsening symptoms.  Hope you feel better soon!    ED  Prescriptions     Medication Sig Dispense Auth. Provider   promethazine -dextromethorphan (PROMETHAZINE -DM) 6.25-15 MG/5ML syrup Take 5 mLs by mouth 4 (four) times daily as needed for cough. 118 mL Tasheem Elms, Jodi R, NP   albuterol  (VENTOLIN  HFA) 108 (90 Base) MCG/ACT inhaler Inhale 1-2 puffs into the lungs every 6 (six) hours as needed for wheezing or shortness of breath. 1 each Loreda Myla SAUNDERS, NP   azithromycin  (ZITHROMAX ) 250 MG tablet Take 1 tablet (250 mg total) by mouth daily. Take first 2 tablets together, then 1 every day until finished. 6 tablet Felix Pratt, Jodi R, NP      PDMP not reviewed this encounter.    [1]  Social History Tobacco Use   Smoking status: Never   Smokeless tobacco: Never  Vaping Use   Vaping status: Never Used  Substance Use Topics   Alcohol use: Yes    Alcohol/week: 3.0 - 4.0 standard drinks of alcohol    Types: 3 - 4 Cans of beer per week    Comment: 2-3 weekly   Drug use: No     Loreda Myla SAUNDERS, NP 03/31/24 1105  "

## 2024-03-31 NOTE — ED Triage Notes (Signed)
 Patient reports productive cough with brown mucus, and  chest congestion x 1 week. Patient reports Mucinex-DM with no relief.

## 2024-03-31 NOTE — Discharge Instructions (Addendum)
 I refilled your albuterol  inhaler to use as needed for any shortness of breath.  Start Promethazine  DM as needed for your cough.  This will make you drowsy so you may take prior to bedtime if needed.  No drinking or driving while on this medication.  Start Flonase  30 minutes before bed to help with your postnasal drip/cough.  Lots of rest and fluids.  A provisional prescription for azithromycin  has been provided.  Please do not take unless your symptoms do not improve  by January 15.  Follow-up with your PCP in 2 to 3 days for recheck.  Please go to the ER if you develop any worsening symptoms.  Hope you feel better soon!
